# Patient Record
Sex: Female | Born: 1970 | Race: White | Hispanic: No | Smoking: Never smoker
Health system: Southern US, Community
[De-identification: ages and names within clinical notes are randomized; demographics above are authoritative.]

## PROBLEM LIST (undated history)

## (undated) DIAGNOSIS — R5382 Chronic fatigue, unspecified: Secondary | ICD-10-CM

## (undated) DIAGNOSIS — E079 Disorder of thyroid, unspecified: Secondary | ICD-10-CM

## (undated) DIAGNOSIS — G9332 Myalgic encephalomyelitis/chronic fatigue syndrome: Secondary | ICD-10-CM

## (undated) DIAGNOSIS — D649 Anemia, unspecified: Secondary | ICD-10-CM

## (undated) DIAGNOSIS — R0602 Shortness of breath: Secondary | ICD-10-CM

## (undated) DIAGNOSIS — R000 Tachycardia, unspecified: Secondary | ICD-10-CM

## (undated) DIAGNOSIS — K219 Gastro-esophageal reflux disease without esophagitis: Secondary | ICD-10-CM

## (undated) DIAGNOSIS — I959 Hypotension, unspecified: Secondary | ICD-10-CM

## (undated) DIAGNOSIS — R51 Headache: Secondary | ICD-10-CM

## (undated) DIAGNOSIS — I951 Orthostatic hypotension: Secondary | ICD-10-CM

## (undated) DIAGNOSIS — H539 Unspecified visual disturbance: Secondary | ICD-10-CM

## (undated) HISTORY — PX: TUBAL LIGATION: SHX77

## (undated) HISTORY — PX: COSMETIC SURGERY: SHX468

## (undated) HISTORY — DX: Unspecified visual disturbance: H53.9

## (undated) HISTORY — PX: UTERINE FIBROID SURGERY: SHX826

---

## 1998-07-21 ENCOUNTER — Other Ambulatory Visit: Admission: RE | Admit: 1998-07-21 | Discharge: 1998-07-21 | Payer: Self-pay | Admitting: Obstetrics and Gynecology

## 1998-09-23 ENCOUNTER — Inpatient Hospital Stay (HOSPITAL_COMMUNITY): Admission: AD | Admit: 1998-09-23 | Discharge: 1998-09-25 | Payer: Self-pay | Admitting: Obstetrics & Gynecology

## 1999-02-05 ENCOUNTER — Inpatient Hospital Stay (HOSPITAL_COMMUNITY): Admission: AD | Admit: 1999-02-05 | Discharge: 1999-02-08 | Payer: Self-pay | Admitting: Obstetrics and Gynecology

## 1999-03-22 ENCOUNTER — Other Ambulatory Visit: Admission: RE | Admit: 1999-03-22 | Discharge: 1999-03-22 | Payer: Self-pay | Admitting: Obstetrics and Gynecology

## 2000-05-02 ENCOUNTER — Other Ambulatory Visit: Admission: RE | Admit: 2000-05-02 | Discharge: 2000-05-02 | Payer: Self-pay | Admitting: Obstetrics and Gynecology

## 2001-01-10 HISTORY — PX: KIDNEY CYST REMOVAL: SHX684

## 2009-01-10 DIAGNOSIS — G90A Postural orthostatic tachycardia syndrome (POTS): Secondary | ICD-10-CM

## 2009-01-10 DIAGNOSIS — I498 Other specified cardiac arrhythmias: Secondary | ICD-10-CM

## 2009-01-10 DIAGNOSIS — I959 Hypotension, unspecified: Secondary | ICD-10-CM

## 2009-01-10 HISTORY — DX: Postural orthostatic tachycardia syndrome (POTS): G90.A

## 2009-01-10 HISTORY — DX: Other specified cardiac arrhythmias: I49.8

## 2009-01-10 HISTORY — DX: Hypotension, unspecified: I95.9

## 2011-12-16 ENCOUNTER — Encounter (HOSPITAL_COMMUNITY): Payer: Self-pay

## 2011-12-16 ENCOUNTER — Emergency Department (HOSPITAL_COMMUNITY): Payer: BC Managed Care – PPO

## 2011-12-16 ENCOUNTER — Observation Stay (HOSPITAL_COMMUNITY)
Admission: EM | Admit: 2011-12-16 | Discharge: 2011-12-18 | Disposition: A | Discharge status: Home / Self Care | Payer: BC Managed Care – PPO | Attending: Internal Medicine | Admitting: Internal Medicine

## 2011-12-16 DIAGNOSIS — G90A Postural orthostatic tachycardia syndrome (POTS): Secondary | ICD-10-CM

## 2011-12-16 DIAGNOSIS — N809 Endometriosis, unspecified: Secondary | ICD-10-CM

## 2011-12-16 DIAGNOSIS — R509 Fever, unspecified: Secondary | ICD-10-CM

## 2011-12-16 DIAGNOSIS — E876 Hypokalemia: Secondary | ICD-10-CM

## 2011-12-16 DIAGNOSIS — N760 Acute vaginitis: Secondary | ICD-10-CM

## 2011-12-16 DIAGNOSIS — B9689 Other specified bacterial agents as the cause of diseases classified elsewhere: Secondary | ICD-10-CM | POA: Insufficient documentation

## 2011-12-16 DIAGNOSIS — E063 Autoimmune thyroiditis: Secondary | ICD-10-CM

## 2011-12-16 DIAGNOSIS — R51 Headache: Secondary | ICD-10-CM | POA: Insufficient documentation

## 2011-12-16 DIAGNOSIS — I498 Other specified cardiac arrhythmias: Secondary | ICD-10-CM

## 2011-12-16 DIAGNOSIS — R55 Syncope and collapse: Principal | ICD-10-CM | POA: Diagnosis present

## 2011-12-16 DIAGNOSIS — A499 Bacterial infection, unspecified: Secondary | ICD-10-CM | POA: Insufficient documentation

## 2011-12-16 HISTORY — DX: Orthostatic hypotension: I95.1

## 2011-12-16 HISTORY — DX: Tachycardia, unspecified: R00.0

## 2011-12-16 HISTORY — DX: Hypotension, unspecified: I95.9

## 2011-12-16 HISTORY — DX: Disorder of thyroid, unspecified: E07.9

## 2011-12-16 LAB — CBC WITH DIFFERENTIAL/PLATELET
Basophils Absolute: 0 10*3/uL (ref 0.0–0.1)
Basophils Relative: 0 % (ref 0–1)
Eosinophils Relative: 0 % (ref 0–5)
HCT: 33.2 % — ABNORMAL LOW (ref 36.0–46.0)
Hemoglobin: 11.5 g/dL — ABNORMAL LOW (ref 12.0–15.0)
Lymphocytes Relative: 5 % — ABNORMAL LOW (ref 12–46)
Lymphocytes Relative: 7 % — ABNORMAL LOW (ref 12–46)
Lymphs Abs: 0.4 10*3/uL — ABNORMAL LOW (ref 0.7–4.0)
MCHC: 35.3 g/dL (ref 30.0–36.0)
MCV: 83.2 fL (ref 78.0–100.0)
Monocytes Absolute: 0.4 10*3/uL (ref 0.1–1.0)
Monocytes Relative: 8 % (ref 3–12)
Neutro Abs: 4.4 10*3/uL (ref 1.7–7.7)
Neutro Abs: 4.2 10*3/uL (ref 1.7–7.7)
Neutrophils Relative %: 91 % — ABNORMAL HIGH (ref 43–77)
Platelets: 113 10*3/uL — ABNORMAL LOW (ref 150–400)
RDW: 11.9 % (ref 11.5–15.5)
RDW: 11.7 % (ref 11.5–15.5)
WBC: 4.8 10*3/uL (ref 4.0–10.5)
WBC: 5 10*3/uL (ref 4.0–10.5)

## 2011-12-16 LAB — COMPREHENSIVE METABOLIC PANEL
ALT: 12 U/L (ref 0–35)
AST: 22 U/L (ref 0–37)
AST: 30 U/L (ref 0–37)
Albumin: 3.4 g/dL — ABNORMAL LOW (ref 3.5–5.2)
BUN: 6 mg/dL (ref 6–23)
CO2: 26 mEq/L (ref 19–32)
CO2: 27 mEq/L (ref 19–32)
Calcium: 8.9 mg/dL (ref 8.4–10.5)
Chloride: 96 mEq/L (ref 96–112)
Chloride: 96 mEq/L (ref 96–112)
Creatinine, Ser: 0.81 mg/dL (ref 0.50–1.10)
GFR calc Af Amer: >90 mL/min (ref >90)
GFR calc non Af Amer: >90 mL/min (ref >90)
GFR calc non Af Amer: >90 mL/min (ref >90)
Glucose, Bld: 100 mg/dL — ABNORMAL HIGH (ref 70–99)
Sodium: 133 mEq/L — ABNORMAL LOW (ref 135–145)
Total Bilirubin: 0.2 mg/dL — ABNORMAL LOW (ref 0.3–1.2)
Total Bilirubin: 0.3 mg/dL (ref 0.3–1.2)

## 2011-12-16 LAB — POCT I-STAT, CHEM 8
BUN: 3 mg/dL — ABNORMAL LOW (ref 6–23)
Creatinine, Ser: 1.1 mg/dL (ref 0.50–1.10)
Glucose, Bld: 140 mg/dL — ABNORMAL HIGH (ref 70–99)
Hemoglobin: 11.6 g/dL — ABNORMAL LOW (ref 12.0–15.0)
Potassium: 3 mEq/L — ABNORMAL LOW (ref 3.5–5.1)
Sodium: 134 mEq/L — ABNORMAL LOW (ref 135–145)

## 2011-12-16 LAB — WET PREP, GENITAL: Yeast Wet Prep HPF POC: NONE SEEN

## 2011-12-16 LAB — POCT I-STAT TROPONIN I

## 2011-12-16 MED ORDER — POTASSIUM CHLORIDE CRYS ER 20 MEQ PO TBCR
80.0000 meq | EXTENDED_RELEASE_TABLET | Freq: Once | ORAL | Status: AC
  Administered 2011-12-16: 80 meq via ORAL
  Filled 2011-12-16: qty 4

## 2011-12-16 MED ORDER — ONDANSETRON 4 MG PO TBDP
8.0000 mg | ORAL_TABLET | Freq: Once | ORAL | Status: AC
  Administered 2011-12-16: 8 mg via ORAL
  Filled 2011-12-16: qty 2

## 2011-12-16 MED ORDER — MAGNESIUM SULFATE 40 MG/ML IJ SOLN
2.0000 g | Freq: Once | INTRAMUSCULAR | Status: AC
  Administered 2011-12-16: 2 g via INTRAVENOUS
  Filled 2011-12-16: qty 50

## 2011-12-16 NOTE — ED Notes (Signed)
Pt metating appropriately. Pt denies dizziness currently while laying in bed. Pts boyfriend states pt was "catatonic like" in pain for nearly 2 days. Pt denies chest pain. Pt denies shortness of breath currently but states increases with walking. Pt states nausea.

## 2011-12-16 NOTE — ED Notes (Signed)
Pt updated on wait time and care 

## 2011-12-16 NOTE — ED Notes (Addendum)
Arthor Captain, PA at bedside. States pt has POTS and is unable to regulate autonomic response system. Pt has endometriosis. Autoimmune disorder of thyroid. Pt has c/o severe abdominal pain for past 2 days. Pt had firstsyncopal episode Tuesday.

## 2011-12-16 NOTE — ED Provider Notes (Signed)
History     CSN: [id number]  Arrival date & time 12/16/11  1519   First MD Initiated Contact with Patient 12/16/11 1948      Chief Complaint  Patient presents with  . Headache  . Nausea  . Abdominal Pain    (Consider location/radiation/quality/duration/timing/severity/associated sxs/prior treatment) HPI Comments: 41 year old female with a past medical history of postural orthostatic tachycardia syndrome, endometriosis and Hashimoto's thyroiditis presents today with chief complaint of abdominal pain and hypotension.  Patient was seen today by her physician and given 2 bags of IV fluid for chronic hypertension. She has had increasing pelvic pain which she attributes to her endometriosis.  Patient had a fever of 103 this week and one episode of syncope which she also attributes to her POTS. She has never fully syncopized before. Patient's lab's show hypokalemia. Patient denies urinary symptoms or vaginal symptoms.    Denies DOE, SOB, chest tightness or pressure, radiation to left arm, jaw or back, or diaphoresis. Denies dysuria, flank pain, frequency, urgency, or hematuria. Denies headaches, visual disturbances. Denies vomiting or diarrhea.   The history is provided by the patient. No language interpreter was used.    Past Medical History  Diagnosis Date  . Thyroid disease   . Hypotension 2011  . POTS (postural orthostatic tachycardia syndrome) 2011    Past Surgical History  Procedure Date  . Cesarean section   . Cosmetic surgery   . Uterine fibroid surgery   . Kidney cyst removal 2003    No family history on file.  History  Substance Use Topics  . Smoking status: Never Smoker   . Smokeless tobacco: Not on file  . Alcohol Use: Yes    OB History    Grav Para Term Preterm Abortions TAB SAB Ect Mult Living                  Review of Systems Ten systems are reviewed and are negative for acute change except as noted in the HPI  Allergies  Sulfa antibiotics  Home  Medications   Current Outpatient Rx  Name  Route  Sig  Dispense  Refill  . AMPHETAMINE-DEXTROAMPHET ER 15 MG PO CP24   Oral   Take 15 mg by mouth daily as needed. For energy         . FLUDROCORTISONE ACETATE 0.1 MG PO TABS   Oral   Take 0.05 mg by mouth 2 (two) times daily.         . IBUPROFEN 200 MG PO TABS   Oral   Take 200 mg by mouth every 6 (six) hours as needed. For pain         . THYROID 60 MG PO TABS   Oral   Take 60 mg by mouth daily.           BP 99/62  Pulse 70  Temp 101.7 F (38.7 C) (Oral)  Resp 21  SpO2 95%  LMP 12/11/2011  Physical Exam  Nursing note and vitals reviewed. Constitutional: She is oriented to person, place, and time. She appears well-developed. No distress.       Thin   HENT:  Head: Normocephalic and atraumatic.  Eyes: Conjunctivae normal and EOM are normal. Pupils are equal, round, and reactive to light.  Cardiovascular: Normal rate, regular rhythm and normal heart sounds.   Pulmonary/Chest: Effort normal and breath sounds normal.  Abdominal: Soft. Bowel sounds are normal. She exhibits no distension. There is tenderness (suprapubic).  Genitourinary:  Pelvic exam: normal external genitalia, vulva, vagina, cervix, uterus and adnexa.   Musculoskeletal: Normal range of motion. She exhibits no edema and no tenderness.  Neurological: She is alert and oriented to person, place, and time.  Skin: Skin is warm and dry. She is not diaphoretic.    ED Course  Procedures (including critical care time)  Results for orders placed during the hospital encounter of 12/16/11  CBC WITH DIFFERENTIAL      Component Value Range   WBC 4.8  4.0 - 10.5 K/uL   RBC 4.02  3.87 - 5.11 MIL/uL   Hemoglobin 11.9 (*) 12.0 - 15.0 g/dL   HCT 40.9 (*) 81.1 - 91.4 %   MCV 83.8  78.0 - 100.0 fL   MCH 29.6  26.0 - 34.0 pg   MCHC 35.3  30.0 - 36.0 g/dL   RDW 78.2  95.6 - 21.3 %   Platelets 113 (*) 150 - 400 K/uL   Neutrophils Relative 91 (*) 43 - 77 %    Neutro Abs 4.4  1.7 - 7.7 K/uL   Lymphocytes Relative 5 (*) 12 - 46 %   Lymphs Abs 0.3 (*) 0.7 - 4.0 K/uL   Monocytes Relative 4  3 - 12 %   Monocytes Absolute 0.2  0.1 - 1.0 K/uL   Eosinophils Relative 0  0 - 5 %   Eosinophils Absolute 0.0  0.0 - 0.7 K/uL   Basophils Relative 0  0 - 1 %   Basophils Absolute 0.0  0.0 - 0.1 K/uL  COMPREHENSIVE METABOLIC PANEL      Component Value Range   Sodium 133 (*) 135 - 145 mEq/L   Potassium 2.8 (*) 3.5 - 5.1 mEq/L   Chloride 96  96 - 112 mEq/L   CO2 26  19 - 32 mEq/L   Glucose, Bld 143 (*) 70 - 99 mg/dL   BUN 5 (*) 6 - 23 mg/dL   Creatinine, Ser 0.86  0.50 - 1.10 mg/dL   Calcium 8.9  8.4 - 57.8 mg/dL   Total Protein 6.8  6.0 - 8.3 g/dL   Albumin 3.4 (*) 3.5 - 5.2 g/dL   AST 22  0 - 37 U/L   ALT 12  0 - 35 U/L   Alkaline Phosphatase 42  39 - 117 U/L   Total Bilirubin 0.2 (*) 0.3 - 1.2 mg/dL   GFR calc non Af Amer >90  >90 mL/min   GFR calc Af Amer >90  >90 mL/min  POCT PREGNANCY, URINE      Component Value Range   Preg Test, Ur NEGATIVE  NEGATIVE  POCT I-STAT, CHEM 8      Component Value Range   Sodium 134 (*) 135 - 145 mEq/L   Potassium 3.0 (*) 3.5 - 5.1 mEq/L   Chloride 98  96 - 112 mEq/L   BUN 3 (*) 6 - 23 mg/dL   Creatinine, Ser 4.69  0.50 - 1.10 mg/dL   Glucose, Bld 629 (*) 70 - 99 mg/dL   Calcium, Ion 5.28 (*) 1.12 - 1.23 mmol/L   TCO2 26  0 - 100 mmol/L   Hemoglobin 11.6 (*) 12.0 - 15.0 g/dL   HCT 41.3 (*) 24.4 - 01.0 %  POCT I-STAT TROPONIN I      Component Value Range   Troponin i, poc 0.00  0.00 - 0.08 ng/mL   Comment 3           CBC WITH DIFFERENTIAL  Component Value Range   WBC 5.0  4.0 - 10.5 K/uL   RBC 3.99  3.87 - 5.11 MIL/uL   Hemoglobin 11.5 (*) 12.0 - 15.0 g/dL   HCT 40.9 (*) 81.1 - 91.4 %   MCV 83.2  78.0 - 100.0 fL   MCH 28.8  26.0 - 34.0 pg   MCHC 34.6  30.0 - 36.0 g/dL   RDW 78.2  95.6 - 21.3 %   Platelets 94 (*) 150 - 400 K/uL   Neutrophils Relative 85 (*) 43 - 77 %   Neutro Abs 4.2  1.7 - 7.7  K/uL   Lymphocytes Relative 7 (*) 12 - 46 %   Lymphs Abs 0.4 (*) 0.7 - 4.0 K/uL   Monocytes Relative 8  3 - 12 %   Monocytes Absolute 0.4  0.1 - 1.0 K/uL   Eosinophils Relative 0  0 - 5 %   Eosinophils Absolute 0.0  0.0 - 0.7 K/uL   Basophils Relative 1  0 - 1 %   Basophils Absolute 0.0  0.0 - 0.1 K/uL  COMPREHENSIVE METABOLIC PANEL      Component Value Range   Sodium 133 (*) 135 - 145 mEq/L   Potassium 3.3 (*) 3.5 - 5.1 mEq/L   Chloride 96  96 - 112 mEq/L   CO2 27  19 - 32 mEq/L   Glucose, Bld 100 (*) 70 - 99 mg/dL   BUN 6  6 - 23 mg/dL   Creatinine, Ser 0.86  0.50 - 1.10 mg/dL   Calcium 8.9  8.4 - 57.8 mg/dL   Total Protein 6.3  6.0 - 8.3 g/dL   Albumin 3.2 (*) 3.5 - 5.2 g/dL   AST 30  0 - 37 U/L   ALT 17  0 - 35 U/L   Alkaline Phosphatase 39  39 - 117 U/L   Total Bilirubin 0.3  0.3 - 1.2 mg/dL   GFR calc non Af Amer >90  >90 mL/min   GFR calc Af Amer >90  >90 mL/min    Date: 12/16/2011  Rate: 83  Rhythm: normal sinus rhythm  QRS Axis: normal  Intervals: normal  ST/T Wave abnormalities: normal  Conduction Disutrbances: none  Narrative Interpretation:   Old EKG Reviewed: No significant changes noted       1. Hypokalemia   2. Syncope       MDM  11:37 PM BP 106/64  Pulse 79  Temp 99.4 F (37.4 C) (Oral)  Resp 18  SpO2 100%  LMP 12/11/2011  Patient hypokalemic  To 2.8 on presentation. With repletion, now 3.3. Pelvic exam performed without visible abnormality.   Awainting wet prep results.   Dr. Joneen Roach will consult on the patient for admission for new onset syncope.      Arthor Captain, PA-C 12/17/11 1134  Arthor Captain, PA-C 12/17/11 [address] PA-C 12/17/11 1138

## 2011-12-16 NOTE — ED Notes (Signed)
Pt denies nausea; pt states feeling chills. Pt denies shortness of breath and chest pain currently.

## 2011-12-16 NOTE — ED Notes (Signed)
Pt states has had fever since early Wednesday. Pt took ibuprofen which helped some.

## 2011-12-16 NOTE — ED Notes (Signed)
Pt sts she has pots syndrome.  Pt sts she was at the clinic earlier and received 2 bags of fluids, to get her bp up, which she sts was 86/42.  Pt sts the fluids did not help her bp.  Pt sts she has endometriosis and is experiencing a lot of pain

## 2011-12-17 ENCOUNTER — Other Ambulatory Visit: Payer: Self-pay

## 2011-12-17 ENCOUNTER — Encounter (HOSPITAL_COMMUNITY): Payer: Self-pay | Admitting: Family Medicine

## 2011-12-17 DIAGNOSIS — A499 Bacterial infection, unspecified: Secondary | ICD-10-CM

## 2011-12-17 DIAGNOSIS — B9689 Other specified bacterial agents as the cause of diseases classified elsewhere: Secondary | ICD-10-CM

## 2011-12-17 DIAGNOSIS — E876 Hypokalemia: Secondary | ICD-10-CM

## 2011-12-17 DIAGNOSIS — N760 Acute vaginitis: Secondary | ICD-10-CM

## 2011-12-17 DIAGNOSIS — R55 Syncope and collapse: Secondary | ICD-10-CM | POA: Diagnosis present

## 2011-12-17 DIAGNOSIS — R000 Tachycardia, unspecified: Secondary | ICD-10-CM

## 2011-12-17 DIAGNOSIS — R509 Fever, unspecified: Secondary | ICD-10-CM

## 2011-12-17 DIAGNOSIS — N809 Endometriosis, unspecified: Secondary | ICD-10-CM

## 2011-12-17 DIAGNOSIS — E063 Autoimmune thyroiditis: Secondary | ICD-10-CM

## 2011-12-17 DIAGNOSIS — I951 Orthostatic hypotension: Secondary | ICD-10-CM

## 2011-12-17 DIAGNOSIS — I498 Other specified cardiac arrhythmias: Secondary | ICD-10-CM

## 2011-12-17 LAB — CBC
Hemoglobin: 10.7 g/dL — ABNORMAL LOW (ref 12.0–15.0)
MCH: 29.2 pg (ref 26.0–34.0)
MCHC: 35 g/dL (ref 30.0–36.0)
Platelets: 93 10*3/uL — ABNORMAL LOW (ref 150–400)
RBC: 3.67 MIL/uL — ABNORMAL LOW (ref 3.87–5.11)

## 2011-12-17 LAB — URINALYSIS, ROUTINE W REFLEX MICROSCOPIC
Hgb urine dipstick: NEGATIVE
Leukocytes, UA: NEGATIVE
Specific Gravity, Urine: 1.008 (ref 1.005–1.030)

## 2011-12-17 LAB — CREATININE, SERUM
Creatinine, Ser: 0.79 mg/dL (ref 0.50–1.10)
GFR calc non Af Amer: >90 mL/min (ref >90)

## 2011-12-17 LAB — LACTIC ACID, PLASMA: Lactic Acid, Venous: 1.5 mmol/L (ref 0.5–2.2)

## 2011-12-17 MED ORDER — ONDANSETRON HCL 4 MG PO TABS
4.0000 mg | ORAL_TABLET | Freq: Four times a day (QID) | ORAL | Status: DC | PRN
  Administered 2011-12-17: 4 mg via ORAL

## 2011-12-17 MED ORDER — ALUM & MAG HYDROXIDE-SIMETH 200-200-20 MG/5ML PO SUSP: 30.0000 mL | Freq: Four times a day (QID) | ORAL | Status: DC | PRN

## 2011-12-17 MED ORDER — ONDANSETRON HCL 4 MG/2ML IJ SOLN
4.0000 mg | Freq: Four times a day (QID) | INTRAMUSCULAR | Status: DC | PRN
  Filled 2011-12-17: qty 2

## 2011-12-17 MED ORDER — MORPHINE SULFATE 2 MG/ML IJ SOLN: 2.0000 mg | INTRAMUSCULAR | Status: DC | PRN

## 2011-12-17 MED ORDER — FLUDROCORTISONE ACETATE 0.1 MG PO TABS
0.0500 mg | ORAL_TABLET | Freq: Two times a day (BID) | ORAL | Status: DC
  Administered 2011-12-17: 0.05 mg via ORAL
  Administered 2011-12-17: 11:00:00 via ORAL
  Administered 2011-12-18: 0.05 mg via ORAL
  Filled 2011-12-17 (×5): qty 0.5

## 2011-12-17 MED ORDER — ACETAMINOPHEN 650 MG RE SUPP: 650.0000 mg | Freq: Four times a day (QID) | RECTAL | Status: DC | PRN

## 2011-12-17 MED ORDER — ENOXAPARIN SODIUM 40 MG/0.4ML ~~LOC~~ SOLN
40.0000 mg | SUBCUTANEOUS | Status: DC
  Administered 2011-12-17 – 2011-12-18 (×2): 40 mg via SUBCUTANEOUS
  Filled 2011-12-17 (×2): qty 0.4

## 2011-12-17 MED ORDER — ACETAMINOPHEN 325 MG PO TABS
650.0000 mg | ORAL_TABLET | Freq: Four times a day (QID) | ORAL | Status: DC | PRN
  Administered 2011-12-17 (×3): 650 mg via ORAL
  Filled 2011-12-17 (×3): qty 2

## 2011-12-17 MED ORDER — SODIUM CHLORIDE 0.9 % IJ SOLN: 3.0000 mL | Freq: Two times a day (BID) | INTRAMUSCULAR | Status: DC

## 2011-12-17 MED ORDER — POTASSIUM CHLORIDE CRYS ER 20 MEQ PO TBCR
40.0000 meq | EXTENDED_RELEASE_TABLET | Freq: Once | ORAL | Status: AC
  Administered 2011-12-17: 40 meq via ORAL
  Filled 2011-12-17: qty 2

## 2011-12-17 MED ORDER — HYDROCODONE-ACETAMINOPHEN 5-325 MG PO TABS
1.0000 | ORAL_TABLET | ORAL | Status: DC | PRN
  Administered 2011-12-18: 1 via ORAL
  Filled 2011-12-17: qty 1

## 2011-12-17 MED ORDER — POTASSIUM CHLORIDE IN NACL 20-0.9 MEQ/L-% IV SOLN
INTRAVENOUS | Status: DC
  Administered 2011-12-17 – 2011-12-18 (×4): via INTRAVENOUS
  Filled 2011-12-17 (×8): qty 1000

## 2011-12-17 MED ORDER — SENNOSIDES-DOCUSATE SODIUM 8.6-50 MG PO TABS
1.0000 | ORAL_TABLET | Freq: Every evening | ORAL | Status: DC | PRN
  Filled 2011-12-17: qty 1

## 2011-12-17 MED ORDER — METRONIDAZOLE 500 MG PO TABS
2000.0000 mg | ORAL_TABLET | Freq: Once | ORAL | Status: AC
  Administered 2011-12-17: 2000 mg via ORAL
  Filled 2011-12-17: qty 4

## 2011-12-17 MED ORDER — THYROID 60 MG PO TABS
60.0000 mg | ORAL_TABLET | Freq: Every day | ORAL | Status: DC
  Administered 2011-12-17 – 2011-12-18 (×2): 60 mg via ORAL
  Filled 2011-12-17 (×2): qty 1

## 2011-12-17 NOTE — H&P (Addendum)
PCP:   Dr. Danae Chen Endocrinologist: Dr. Juliet Rude Neurologist: dr Charmayne Sheer   Chief Complaint:  Dizziness/syncope  HPI: This is an unfortunate 41 year old female with posterior orthostatic tachycardic syndrome (POTS) diagnosed approximately 2 years ago. She's never been on medication for this. She states her symptoms are often worse with her menstrual period. She also has endometriosis diagnosed by exploratory laparoscopy. With this menstrual cycle her symptoms again flared, however, they were different and worse than normal. Initially, she she was inr lightheadedness, dizziness and tachycardic on standing. She was also hypotensive with systolic blood pressures in the mid 80s. On Wednesday she got out of bed and syncopized. This is the first time she syncopized with her POTS disease. She's has severe headache worse on standing, she is nauseous and not eating. Her menses lasted Monday through Wednesday. On Wednesday she saw her Neurologist Dr Charmayne Sheer, this is her first visit. After hearing her symptoms he prescribed her Florinef which she's been taking 0.5 mg twice daily. She has continued to feel badly and has remained hypotensive and so she's been in bed since Wednesday. She's continued to have low-grade fevers. Today she went to an urgent care clinic where she was given 2 L of normal saline, this usually makes her feel better. She went home she continued to feel ill, she continued to have low-grade and fevers and so she came to the ER. In the ER the patient's systolic blood pressure is now in the low 100s. She's also continued to have abdominal discomfort despite the fact that her menses has stopped. History provided by the patient. She denies any burning urination, diarrhea, cough, shortness of breath, myalgia, viral symptoms.  Review of Systems:  The patient denies anorexia, weight loss,, vision loss, decreased hearing, hoarseness, chest pain, dyspnea on exertion, peripheral edema, balance deficits,  hemoptysis, melena, hematochezia, severe indigestion/heartburn, hematuria, incontinence, genital sores, muscle weakness, suspicious skin lesions, transient blindness, difficulty walking, depression, unusual weight change, abnormal bleeding, enlarged lymph nodes, angioedema, and breast masses.  Past Medical History: Past Medical History  Diagnosis Date  . Thyroid disease   . Hypotension 2011  . POTS (postural orthostatic tachycardia syndrome) 2011   Past Surgical History  Procedure Date  . Cesarean section   . Cosmetic surgery   . Uterine fibroid surgery   . Kidney cyst removal 2003    Medications: Prior to Admission medications   Medication Sig Start Date End Date Taking? Authorizing Provider  amphetamine-dextroamphetamine (ADDERALL XR) 15 MG 24 hr capsule Take 15 mg by mouth daily as needed. For energy   Yes Historical Provider, MD  fludrocortisone (FLORINEF) 0.1 MG tablet Take 0.05 mg by mouth 2 (two) times daily.   Yes Historical Provider, MD  ibuprofen (ADVIL,MOTRIN) 200 MG tablet Take 200 mg by mouth every 6 (six) hours as needed. For pain   Yes Historical Provider, MD  thyroid (ARMOUR) 60 MG tablet Take 60 mg by mouth daily.   Yes Historical Provider, MD    Allergies:   Allergies  Allergen Reactions  . Sulfa Antibiotics Nausea And Vomiting    Social History:  reports that she has never smoked. She does not have any smokeless tobacco history on file. She reports that she drinks alcohol. Her drug history not on file.  Family History: Lung cancer  Physical Exam: Filed Vitals:   12/17/11 0030 12/17/11 0100 12/17/11 0130 12/17/11 0140  BP: 99/72 93/72 105/63   Pulse: 90 87 92   Temp:    103 F (39.4 C)  TempSrc:    Oral  Resp: 17 21 19    SpO2: 96% 98% 99%     General:  Alert and oriented times three, well developed and nourished, weak-appearing female Eyes: PERRLA, pink conjunctiva, no scleral icterus ENT: dry oral mucosa, neck supple, no thyromegaly Lungs:  clear to ascultation, no wheeze, no crackles, no use of accessory muscles Cardiovascular: regular rate and rhythm, no regurgitation, no gallops, no murmurs. No carotid bruits, no JVD Abdomen: soft, positive BS, nonspecific generalized tenderness to palpation, non-distended, no organomegaly, not an acute abdomen GU: not examined Neuro: CN II - XII grossly intact, sensation intact Musculoskeletal: strength 5/5 all extremities, no clubbing, cyanosis or edema Skin: no rash, no subcutaneous crepitation, no decubitus Psych: appropriate patient   Labs on Admission:   Basename 12/16/11 2050 12/16/11 1603 12/16/11 1540  NA 133* 134* --  K 3.3* 3.0* --  CL 96 98 --  CO2 27 -- 26  GLUCOSE 100* 140* --  BUN 6 3* --  CREATININE 0.81 1.10 --  CALCIUM 8.9 -- 8.9  MG -- -- --  PHOS -- -- --    Basename 12/16/11 2050 12/16/11 1540  AST 30 22  ALT 17 12  ALKPHOS 39 42  BILITOT 0.3 0.2*  PROT 6.3 6.8  ALBUMIN 3.2* 3.4*   No results found for this basename: LIPASE:2,AMYLASE:2 in the last 72 hours  Basename 12/16/11 2050 12/16/11 1603 12/16/11 1540  WBC 5.0 -- 4.8  NEUTROABS 4.2 -- 4.4  HGB 11.5* 11.6* --  HCT 33.2* 34.0* --  MCV 83.2 -- 83.8  PLT 94* -- 113*    Radiological Exams on Admission: Dg Chest Port 1 View  12/16/2011  *RADIOLOGY REPORT*  Clinical Data: Fever, syncope.  PORTABLE CHEST - 1 VIEW  Comparison: None.  Findings: Heart and mediastinal contours are within normal limits. No focal opacities or effusions.  No acute bony abnormality.  IMPRESSION: No active cardiopulmonary disease.   Original Report Authenticated By: Charlett Nose, M.D.     Assessment/Plan Present on Admission:  . Syncope and collapse Posterior orthostatic tachycardia syndrome Bring in for 23 hour for observation Continue aggressive IV fluid hydration , orthostatic vitals in a.m. Blood pressure already improved If blood pressure remains low consider adding Midodrine Continue Florinef Syncope likely due  to hypotension Fever  Doubt infection. Will order urinalysis and cultures. Fever is not a usual presentation with POTS syndrome. No leukocytosis. Will monitor, no antibiotics started. Hypokalemia Likely from Florinef, replacing and IV fluids Abdominal pain/endometriosis Monitor. Pain control  Full code DVT prophylaxis   Shep Porter 12/17/2011, 1:53 AM

## 2011-12-17 NOTE — Progress Notes (Addendum)
[facility]             Progress Note   Subjective: Still febrile and hypotensive. No true complaints. Has noticed increased vaginal secretions.  Objective: Vital signs in last 24 hours: Temp:  [98.7 F (37.1 C)-103 F (39.4 C)] 100.7 F (38.2 C) (12/07 1015) Pulse Rate:  [61-97] 75  (12/07 0652) Resp:  [14-21] 18  (12/07 0652) BP: (83-108)/(51-72) 83/51 mmHg (12/07 0652) SpO2:  [95 %-100 %] 100 % (12/07 0652) Weight:  [55.384 kg (122 lb 1.6 oz)] 55.384 kg (122 lb 1.6 oz) (12/07 0203) Weight change:  Last BM Date: 12/17/11  Intake/Output from previous day:   Total I/O In: 240 [P.O.:240] Out: -    Physical Exam: General: Alert, awake, oriented x3, in no acute distress. HEENT: No bruits, no goiter. Heart: Regular rate and rhythm, without murmurs, rubs, gallops. Lungs: Clear to auscultation bilaterally. Abdomen: Soft, nontender, nondistended, positive bowel sounds. Extremities: No clubbing cyanosis or edema with positive pedal pulses. Neuro: Grossly intact, nonfocal. I have not ambulated her.    Lab Results: Basic Metabolic Panel:  Basename 12/17/11 0300 12/16/11 2050 12/16/11 1603 12/16/11 1540  NA -- 133* 134* --  K -- 3.3* 3.0* --  CL -- 96 98 --  CO2 -- 27 -- 26  GLUCOSE -- 100* 140* --  BUN -- 6 3* --  CREATININE 0.79 0.81 -- --  CALCIUM -- 8.9 -- 8.9  MG -- -- -- --  PHOS -- -- -- --   Liver Function Tests:  Basename 12/16/11 2050 12/16/11 1540  AST 30 22  ALT 17 12  ALKPHOS 39 42  BILITOT 0.3 0.2*  PROT 6.3 6.8  ALBUMIN 3.2* 3.4*   CBC:  Basename 12/17/11 0300 12/16/11 2050 12/16/11 1540  WBC 3.5* 5.0 --  NEUTROABS -- 4.2 4.4  HGB 10.7* 11.5* --  HCT 30.6* 33.2* --  MCV 83.4 83.2 --  PLT 93* 94* --   Urinalysis:  Basename 12/17/11 0651  COLORURINE YELLOW  LABSPEC 1.008  PHURINE 7.0  GLUCOSEU NEGATIVE  HGBUR NEGATIVE  BILIRUBINUR NEGATIVE  KETONESUR 15*  PROTEINUR NEGATIVE  UROBILINOGEN 1.0  NITRITE NEGATIVE   LEUKOCYTESUR NEGATIVE    Recent Results (from the past 240 hour(s))  WET PREP, GENITAL     Status: Abnormal   Collection Time   12/16/11 11:38 PM      Component Value Range Status Comment   Yeast Wet Prep HPF POC NONE SEEN  NONE SEEN Final    Trich, Wet Prep NONE SEEN  NONE SEEN Final    Clue Cells Wet Prep HPF POC MANY (*) NONE SEEN Final    WBC, Wet Prep HPF POC MODERATE (*) NONE SEEN Final     Studies/Results: Dg Chest Port 1 View  12/16/2011  *RADIOLOGY REPORT*  Clinical Data: Fever, syncope.  PORTABLE CHEST - 1 VIEW  Comparison: None.  Findings: Heart and mediastinal contours are within normal limits. No focal opacities or effusions.  No acute bony abnormality.  IMPRESSION: No active cardiopulmonary disease.   Original Report Authenticated By: Charlett Nose, M.D.     Medications: Scheduled Meds:    . enoxaparin (LOVENOX) injection  40 mg Subcutaneous Q24H  . fludrocortisone  0.05 mg Oral BID  . [COMPLETED] magnesium sulfate 1 - 4 g bolus IVPB  2 g Intravenous Once  . metroNIDAZOLE  2,000 mg Oral Once  . [COMPLETED] ondansetron  8 mg Oral Once  . potassium chloride  40 mEq Oral Once  . [  COMPLETED] potassium chloride  80 mEq Oral Once  . sodium chloride  3 mL Intravenous Q12H  . thyroid  60 mg Oral Daily   Continuous Infusions:    . 0.9 % NaCl with KCl 20 mEq / L 125 mL/hr at 12/17/11 1117   PRN Meds:.acetaminophen, acetaminophen, alum & mag hydroxide-simeth, HYDROcodone-acetaminophen, morphine injection, ondansetron (ZOFRAN) IV, ondansetron, senna-docusate  Assessment/Plan:  Active Problems:  Syncope and collapse  POTS (postural orthostatic tachycardia syndrome)  Endometriosis  Hashimoto's thyroiditis  Hypokalemia  BV (bacterial vaginosis)  Fever   Syncope -Likely 2/2 POTS. -No further work up.  POTS -Has been started on florinef. -Continue IVFs.  BV -Give flagyl 2000 mg x 1.  Fever -A little concerning especially given her hypotension. -CXR and U/A  are negative. -Will check blood cultures, influenza panel and lactic acid.  Hypokalemia -Give KDur 40 meq today and recheck in am.  Disposition -Consider DC home once fever resolved, hopefully in am.     Time spent coordinating care: 35 minutes.   LOS: 1 day   [facility] Pager: [phone] 12/17/2011, 12:52 PM

## 2011-12-17 NOTE — ED Notes (Signed)
Informed pt of unit assignment. Waiting for bed assigment

## 2011-12-17 NOTE — ED Notes (Signed)
Tylenol given for temp 103 orally. Pt has 5-6 blanket on , encourage pt to reduced the numbers of blanket she using covering herself. Pt taking PO well.

## 2011-12-17 NOTE — ED Notes (Signed)
Informed RN Imma of patient's temperature at this time.

## 2011-12-18 LAB — GC/CHLAMYDIA PROBE AMP
CT Probe RNA: NEGATIVE
GC Probe RNA: NEGATIVE

## 2011-12-18 LAB — BASIC METABOLIC PANEL
CO2: 24 mEq/L (ref 19–32)
GFR calc non Af Amer: >90 mL/min (ref >90)
Glucose, Bld: 97 mg/dL (ref 70–99)
Potassium: 4.1 mEq/L (ref 3.5–5.1)
Sodium: 137 mEq/L (ref 135–145)

## 2011-12-18 LAB — CBC
Hemoglobin: 9.7 g/dL — ABNORMAL LOW (ref 12.0–15.0)
RBC: 3.35 MIL/uL — ABNORMAL LOW (ref 3.87–5.11)

## 2011-12-18 NOTE — Discharge Summary (Signed)
Physician Discharge Summary  Patient ID: Lisa Rios MRN: [id number] DOB/AGE: August 09, 1970 41 y.o.  Admit date: 12/16/2011 Discharge date: 12/18/2011  Primary Care Physician:  No primary provider on file.   Discharge Diagnoses:    Active Problems:  Syncope and collapse  POTS (postural orthostatic tachycardia syndrome)  Endometriosis  Hashimoto's thyroiditis  Hypokalemia  BV (bacterial vaginosis)  Fever      Medication List     As of 12/18/2011 11:25 AM    TAKE these medications         amphetamine-dextroamphetamine 15 MG 24 hr capsule   Commonly known as: ADDERALL XR   Take 15 mg by mouth daily as needed. For energy      fludrocortisone 0.1 MG tablet   Commonly known as: FLORINEF   Take 0.05 mg by mouth 2 (two) times daily.      ibuprofen 200 MG tablet   Commonly known as: ADVIL,MOTRIN   Take 200 mg by mouth every 6 (six) hours as needed. For pain      thyroid 60 MG tablet   Commonly known as: ARMOUR   Take 60 mg by mouth daily.         Disposition and Follow-up:  Will be discharged home today in stable and improved condition. Has been instructed to follow up with her neurologist as scheduled.  Consults:  None.   Significant Diagnostic Studies:  Dg Chest Port 1 View  12/16/2011  *RADIOLOGY REPORT*  Clinical Data: Fever, syncope.  PORTABLE CHEST - 1 VIEW  Comparison: None.  Findings: Heart and mediastinal contours are within normal limits. No focal opacities or effusions.  No acute bony abnormality.  IMPRESSION: No active cardiopulmonary disease.   Original Report Authenticated By: Charlett Nose, M.D.     Brief H and P For complete details please refer to admission H and P, but in brief patient is a 41 year old female with posterior orthostatic tachycardic syndrome (POTS) diagnosed approximately 2 years ago. She's never been on medication for this. She states her symptoms are often worse with her menstrual period. She also has endometriosis  diagnosed by exploratory laparoscopy. With this menstrual cycle her symptoms again flared, however, they were different and worse than normal. Initially, she she was inr lightheadedness, dizziness and tachycardic on standing. She was also hypotensive with systolic blood pressures in the mid 80s. On Wednesday she got out of bed and syncopized. This is the first time she syncopized with her POTS disease.Today she went to an urgent care clinic where she was given 2 L of normal saline, this usually makes her feel better. She went home she continued to feel ill, she continued to have low-grade and fevers and so she came to the ER. We were asked to admit her for further evaluation and management.      Hospital Course:  Active Problems:  Syncope and collapse  POTS (postural orthostatic tachycardia syndrome)  Endometriosis  Hashimoto's thyroiditis  Hypokalemia  BV (bacterial vaginosis)  Fever   Syncope -2/2 POTS disease. -Continue florinef. -Have also recommended TED stockings.  Bacterial Vaginosis -Has been given 2 gf of Flagyl.  Fever -Last temp was 100.7 24 hours ago. -Source remains unclear. -All cx data is negative. -Flu screen is negative. -Wonder if related to her autonomic dysfunction? -Doubt BV could have caused her low-grade temps.  Hypokalemia -Repleted.   Time spent on Discharge: Greater than 30 minutes.  SignedChaya Jan [facility] Pager: [phone] 12/18/2011, 11:25 AM

## 2011-12-18 NOTE — Progress Notes (Signed)
Utilization review completed.  

## 2011-12-18 NOTE — ED Provider Notes (Signed)
Medical screening examination/treatment/procedure(s) were performed by non-physician practitioner and as supervising physician I was immediately available for consultation/collaboration  Derwood Kaplan, MD 12/18/11 1610

## 2011-12-23 LAB — CULTURE, BLOOD (ROUTINE X 2): Culture: NO GROWTH

## 2012-01-02 ENCOUNTER — Other Ambulatory Visit: Payer: Self-pay | Admitting: Internal Medicine

## 2012-01-02 ENCOUNTER — Ambulatory Visit
Admission: RE | Admit: 2012-01-02 | Discharge: 2012-01-02 | Disposition: A | Discharge status: Home / Self Care | Payer: BC Managed Care – PPO | Source: Ambulatory Visit | Attending: Internal Medicine | Admitting: Internal Medicine

## 2012-01-02 DIAGNOSIS — R053 Chronic cough: Secondary | ICD-10-CM

## 2012-01-02 DIAGNOSIS — R509 Fever, unspecified: Secondary | ICD-10-CM

## 2012-01-02 DIAGNOSIS — R109 Unspecified abdominal pain: Secondary | ICD-10-CM

## 2012-01-02 DIAGNOSIS — R05 Cough: Secondary | ICD-10-CM

## 2012-01-02 MED ORDER — IOHEXOL 300 MG/ML  SOLN
100.0000 mL | Freq: Once | INTRAMUSCULAR | Status: AC | PRN
  Administered 2012-01-02: 100 mL via INTRAVENOUS

## 2012-06-27 ENCOUNTER — Encounter (HOSPITAL_COMMUNITY): Payer: Self-pay | Admitting: Pharmacist

## 2012-06-29 ENCOUNTER — Encounter (HOSPITAL_COMMUNITY)
Admission: RE | Admit: 2012-06-29 | Discharge: 2012-06-29 | Disposition: A | Discharge status: Home / Self Care | Payer: BC Managed Care – PPO | Source: Ambulatory Visit | Attending: Obstetrics and Gynecology | Admitting: Obstetrics and Gynecology

## 2012-06-29 ENCOUNTER — Encounter (HOSPITAL_COMMUNITY): Payer: Self-pay

## 2012-06-29 HISTORY — DX: Anemia, unspecified: D64.9

## 2012-06-29 HISTORY — DX: Chronic fatigue, unspecified: R53.82

## 2012-06-29 HISTORY — DX: Shortness of breath: R06.02

## 2012-06-29 HISTORY — DX: Headache: R51

## 2012-06-29 HISTORY — DX: Gastro-esophageal reflux disease without esophagitis: K21.9

## 2012-06-29 HISTORY — DX: Myalgic encephalomyelitis/chronic fatigue syndrome: G93.32

## 2012-06-29 LAB — CBC
HCT: 35.3 % — ABNORMAL LOW (ref 36.0–46.0)
MCV: 86.1 fL (ref 78.0–100.0)
Platelets: 224 10*3/uL (ref 150–400)
RBC: 4.1 MIL/uL (ref 3.87–5.11)
RDW: 12.6 % (ref 11.5–15.5)
WBC: 7.4 10*3/uL (ref 4.0–10.5)

## 2012-06-29 LAB — BASIC METABOLIC PANEL
Calcium: 9.6 mg/dL (ref 8.4–10.5)
GFR calc non Af Amer: 78 mL/min — ABNORMAL LOW (ref >90)
Glucose, Bld: 90 mg/dL (ref 70–99)
Potassium: 4.2 mEq/L (ref 3.5–5.1)
Sodium: 136 mEq/L (ref 135–145)

## 2012-06-29 LAB — SURGICAL PCR SCREEN: Staphylococcus aureus: NEGATIVE

## 2012-06-29 NOTE — Patient Instructions (Signed)
Your procedure is scheduled on:07/02/12  Enter through the Main Entrance at :6am Pick up desk phone and dial [phone] and inform us of your arrival.  Please call [phone] if you have any problems the morning of surgery.  Remember: Do not eat or drink after midnight:Sunday- clear liquids ok until 0330 am   You may brush your teeth the morning of surgery.  Take these meds the morning of surgery with a sip of water: Thyroid  DO NOT wear jewelry, eye make-up, lipstick,body lotion, or dark fingernail polish.  (Polished toes are ok)   If you are to be admitted after surgery, leave suitcase in car until your room has been assigned.  Wear loose fitting, comfortable clothes for your ride home.

## 2012-06-29 NOTE — Pre-Procedure Instructions (Signed)
Discussed this patient with Dr. Malen Gauze- he is aware of pt's dx of POTS

## 2012-07-01 MED ORDER — DEXTROSE 5 % IV SOLN
2.0000 g | INTRAVENOUS | Status: AC
  Administered 2012-07-02: 2 g via INTRAVENOUS
  Filled 2012-07-01: qty 2

## 2012-07-01 NOTE — H&P (Addendum)
[facility]  [id number]  06/28/12 S: Lisa Rios goes by [employer].  She presents today for preop evaluation.  She has been having worsening problems with pelvic pain and dyspareunia.  She does have a history of a right ovarian cyst recurrence and also a history of abnormal uterine bleeding.  She had a recent hysteroscopy, D&C and NovaSure ablation for this with benign endometrial polyp.  At this time she desires definitive surgical intervention due to ongoing pain and presents for laparoscopically assisted vaginal hysterectomy.  She would also like removal of the right tube and ovary.  O: Physical exam:  Heart:  Regular rate and rhythm.  Lungs clear to auscultation bilaterally.  Abdomen is nondistended, nontender.  Well-healed Pfannenstiel incision and umbilical incision.  Abdomen is soft, nontender, nondistended.  Pelvic exam:  Uterus is anteverted, mobile and nontender.  No adnexal masses are palpable.  Cervix appears to be normal.  There is no significant rectocele or cystocele.  Past medical history:  The patient has history of migraines, irritable bowel syndrome, anemia, epilepsy.  She has POTS, anxiety disorder.  Previous surgeries:  She has had Cesarean sections, ovarian cyst removal, tubal ligation and NovaSure endometrial ablation.  Medications:  She is on Armor Thyroid, metformin, Adderall and a variety of different creams including iodine, iron, vitamin D, progesterone, testosterone, B12, methyl folate and vitamin B6.  She has an allergy to sulfa. A&P: Pelvic pain, abnormal uterine bleeding, recurrent right ovarian cysts, dyspareunia.  The patient desires definitive surgical intervention and requests hysterectomy and removal of the right tube and ovary.  Discussed laparoscopically assisted vaginal hysterectomy and right salpingo-oophorectomy at length.  Discussed the risks and benefits, pros and cons.  Discussed the fact that this may not alleviate the pain and that it could recur or worsen.  Discussed the  risks including but not limited to risk of infection, bleeding, damage to bowel, bladder, ureters, ovary, risks associated with anesthesia, blood transfusion.  Discussed the procedure at length.  She gives her informed consent and wishes to proceed. Dineen Kid Rana Snare, MD/rg  This patient has been seen and examined.   All of her questions were answered.  Labs and vital signs reviewed.  Informed consent has been obtained.  The History and Physical is current. 07/02/12 0715 DL

## 2012-07-02 ENCOUNTER — Observation Stay (HOSPITAL_COMMUNITY)
Admission: RE | Admit: 2012-07-02 | Discharge: 2012-07-03 | Disposition: A | Discharge status: Home / Self Care | Payer: BC Managed Care – PPO | Source: Ambulatory Visit | Attending: Obstetrics and Gynecology | Admitting: Obstetrics and Gynecology

## 2012-07-02 ENCOUNTER — Encounter (HOSPITAL_COMMUNITY): Payer: Self-pay | Admitting: Registered Nurse

## 2012-07-02 ENCOUNTER — Encounter (HOSPITAL_COMMUNITY): Payer: Self-pay | Admitting: Anesthesiology

## 2012-07-02 ENCOUNTER — Ambulatory Visit (HOSPITAL_COMMUNITY): Payer: BC Managed Care – PPO | Admitting: Anesthesiology

## 2012-07-02 ENCOUNTER — Encounter (HOSPITAL_COMMUNITY)
Admission: RE | Disposition: A | Discharge status: Home / Self Care | Payer: Self-pay | Source: Ambulatory Visit | Attending: Obstetrics and Gynecology

## 2012-07-02 DIAGNOSIS — N938 Other specified abnormal uterine and vaginal bleeding: Principal | ICD-10-CM | POA: Insufficient documentation

## 2012-07-02 DIAGNOSIS — N83209 Unspecified ovarian cyst, unspecified side: Secondary | ICD-10-CM | POA: Insufficient documentation

## 2012-07-02 DIAGNOSIS — Z90710 Acquired absence of both cervix and uterus: Secondary | ICD-10-CM

## 2012-07-02 DIAGNOSIS — N840 Polyp of corpus uteri: Secondary | ICD-10-CM | POA: Insufficient documentation

## 2012-07-02 DIAGNOSIS — IMO0002 Reserved for concepts with insufficient information to code with codable children: Secondary | ICD-10-CM | POA: Insufficient documentation

## 2012-07-02 DIAGNOSIS — N949 Unspecified condition associated with female genital organs and menstrual cycle: Secondary | ICD-10-CM | POA: Insufficient documentation

## 2012-07-02 HISTORY — PX: SALPINGOOPHORECTOMY: SHX82

## 2012-07-02 HISTORY — PX: LAPAROSCOPIC ASSISTED VAGINAL HYSTERECTOMY: SHX5398

## 2012-07-02 SURGERY — HYSTERECTOMY, VAGINAL, LAPAROSCOPY-ASSISTED
Anesthesia: General | Laterality: Right | Wound class: Clean Contaminated

## 2012-07-02 MED ORDER — HYDROMORPHONE HCL PF 1 MG/ML IJ SOLN: 0.2500 mg | INTRAMUSCULAR | Status: DC | PRN

## 2012-07-02 MED ORDER — ONDANSETRON HCL 4 MG/2ML IJ SOLN
INTRAMUSCULAR | Status: AC
  Filled 2012-07-02: qty 2

## 2012-07-02 MED ORDER — MENTHOL 3 MG MT LOZG: 1.0000 | LOZENGE | OROMUCOSAL | Status: DC | PRN

## 2012-07-02 MED ORDER — ROCURONIUM BROMIDE 50 MG/5ML IV SOLN
INTRAVENOUS | Status: AC
  Filled 2012-07-02: qty 1

## 2012-07-02 MED ORDER — SODIUM CHLORIDE 0.9 % IJ SOLN: 9.0000 mL | INTRAMUSCULAR | Status: DC | PRN

## 2012-07-02 MED ORDER — 0.9 % SODIUM CHLORIDE (POUR BTL) OPTIME
TOPICAL | Status: DC | PRN
  Administered 2012-07-02: 1000 mL

## 2012-07-02 MED ORDER — NEOSTIGMINE METHYLSULFATE 1 MG/ML IJ SOLN
INTRAMUSCULAR | Status: AC
  Filled 2012-07-02: qty 1

## 2012-07-02 MED ORDER — AMPHETAMINE-DEXTROAMPHET ER 15 MG PO CP24: 15.0000 mg | ORAL_CAPSULE | Freq: Every day | ORAL | Status: DC

## 2012-07-02 MED ORDER — MIDAZOLAM HCL 2 MG/2ML IJ SOLN: 0.5000 mg | Freq: Once | INTRAMUSCULAR | Status: DC | PRN

## 2012-07-02 MED ORDER — GLYCOPYRROLATE 0.2 MG/ML IJ SOLN
INTRAMUSCULAR | Status: DC | PRN
  Administered 2012-07-02: 0.4 mg via INTRAVENOUS

## 2012-07-02 MED ORDER — BUPIVACAINE HCL (PF) 0.25 % IJ SOLN
INTRAMUSCULAR | Status: DC | PRN
  Administered 2012-07-02: 10 mL

## 2012-07-02 MED ORDER — THYROID 60 MG PO TABS
60.0000 mg | ORAL_TABLET | Freq: Every day | ORAL | Status: DC
  Administered 2012-07-03: 60 mg via ORAL
  Filled 2012-07-02 (×2): qty 1

## 2012-07-02 MED ORDER — GLYCOPYRROLATE 0.2 MG/ML IJ SOLN
INTRAMUSCULAR | Status: AC
  Filled 2012-07-02: qty 2

## 2012-07-02 MED ORDER — ZOLPIDEM TARTRATE 5 MG PO TABS: 5.0000 mg | ORAL_TABLET | Freq: Every evening | ORAL | Status: DC | PRN

## 2012-07-02 MED ORDER — LIDOCAINE HCL (CARDIAC) 20 MG/ML IV SOLN
INTRAVENOUS | Status: AC
  Filled 2012-07-02: qty 5

## 2012-07-02 MED ORDER — PROPOFOL 10 MG/ML IV EMUL
INTRAVENOUS | Status: AC
  Filled 2012-07-02: qty 20

## 2012-07-02 MED ORDER — DIPHENHYDRAMINE HCL 12.5 MG/5ML PO ELIX: 12.5000 mg | ORAL_SOLUTION | Freq: Four times a day (QID) | ORAL | Status: DC | PRN

## 2012-07-02 MED ORDER — DEXTROSE-NACL 5-0.45 % IV SOLN
INTRAVENOUS | Status: DC
  Administered 2012-07-02 – 2012-07-03 (×3): via INTRAVENOUS

## 2012-07-02 MED ORDER — DEXAMETHASONE SODIUM PHOSPHATE 10 MG/ML IJ SOLN
INTRAMUSCULAR | Status: DC | PRN
  Administered 2012-07-02: 10 mg via INTRAVENOUS

## 2012-07-02 MED ORDER — LIDOCAINE HCL (CARDIAC) 20 MG/ML IV SOLN
INTRAVENOUS | Status: DC | PRN
  Administered 2012-07-02: 60 mg via INTRAVENOUS

## 2012-07-02 MED ORDER — KETOROLAC TROMETHAMINE 30 MG/ML IJ SOLN
30.0000 mg | Freq: Four times a day (QID) | INTRAMUSCULAR | Status: DC | PRN
  Administered 2012-07-02 – 2012-07-03 (×2): 30 mg via INTRAVENOUS
  Filled 2012-07-02 (×2): qty 1

## 2012-07-02 MED ORDER — NEOSTIGMINE METHYLSULFATE 1 MG/ML IJ SOLN
INTRAMUSCULAR | Status: DC | PRN
  Administered 2012-07-02: 2 mg via INTRAVENOUS

## 2012-07-02 MED ORDER — SCOPOLAMINE 1 MG/3DAYS TD PT72: 1.0000 | MEDICATED_PATCH | TRANSDERMAL | Status: DC

## 2012-07-02 MED ORDER — HYDROMORPHONE HCL PF 1 MG/ML IJ SOLN
INTRAMUSCULAR | Status: DC | PRN
  Administered 2012-07-02 (×2): 0.5 mg via INTRAVENOUS

## 2012-07-02 MED ORDER — LACTATED RINGERS IR SOLN
Status: DC | PRN
  Administered 2012-07-02: 3000 mL

## 2012-07-02 MED ORDER — BUPIVACAINE HCL (PF) 0.25 % IJ SOLN
INTRAMUSCULAR | Status: AC
  Filled 2012-07-02: qty 30

## 2012-07-02 MED ORDER — MIDAZOLAM HCL 2 MG/2ML IJ SOLN
INTRAMUSCULAR | Status: AC
  Filled 2012-07-02: qty 2

## 2012-07-02 MED ORDER — HYDROMORPHONE HCL PF 1 MG/ML IJ SOLN
INTRAMUSCULAR | Status: AC
  Filled 2012-07-02: qty 1

## 2012-07-02 MED ORDER — ROCURONIUM BROMIDE 100 MG/10ML IV SOLN
INTRAVENOUS | Status: DC | PRN
  Administered 2012-07-02: 50 mg via INTRAVENOUS

## 2012-07-02 MED ORDER — FENTANYL CITRATE 0.05 MG/ML IJ SOLN
INTRAMUSCULAR | Status: AC
  Filled 2012-07-02: qty 5

## 2012-07-02 MED ORDER — DIPHENHYDRAMINE HCL 50 MG/ML IJ SOLN
INTRAMUSCULAR | Status: AC
  Administered 2012-07-02: 12.5 mg via INTRAVENOUS
  Filled 2012-07-02: qty 1

## 2012-07-02 MED ORDER — DIPHENHYDRAMINE HCL 50 MG/ML IJ SOLN: 12.5000 mg | Freq: Four times a day (QID) | INTRAMUSCULAR | Status: DC | PRN

## 2012-07-02 MED ORDER — LACTATED RINGERS IV SOLN
INTRAVENOUS | Status: DC
  Administered 2012-07-02 (×2): via INTRAVENOUS

## 2012-07-02 MED ORDER — ONDANSETRON HCL 4 MG/2ML IJ SOLN: 4.0000 mg | Freq: Four times a day (QID) | INTRAMUSCULAR | Status: DC | PRN

## 2012-07-02 MED ORDER — DIPHENHYDRAMINE HCL 50 MG/ML IJ SOLN: 12.5000 mg | Freq: Once | INTRAMUSCULAR | Status: AC

## 2012-07-02 MED ORDER — MEPERIDINE HCL 25 MG/ML IJ SOLN: 6.2500 mg | INTRAMUSCULAR | Status: DC | PRN

## 2012-07-02 MED ORDER — HYDROMORPHONE 0.3 MG/ML IV SOLN
INTRAVENOUS | Status: DC
  Administered 2012-07-02: 0.6 mg via INTRAVENOUS
  Administered 2012-07-02: 11:00:00 via INTRAVENOUS
  Administered 2012-07-02 (×2): 2.5 mg via INTRAVENOUS
  Administered 2012-07-03 (×2): 0.399 mg via INTRAVENOUS
  Filled 2012-07-02: qty 25

## 2012-07-02 MED ORDER — PROMETHAZINE HCL 25 MG/ML IJ SOLN: 6.2500 mg | INTRAMUSCULAR | Status: DC | PRN

## 2012-07-02 MED ORDER — MIDAZOLAM HCL 5 MG/5ML IJ SOLN
INTRAMUSCULAR | Status: DC | PRN
  Administered 2012-07-02: 2 mg via INTRAVENOUS

## 2012-07-02 MED ORDER — PHENYLEPHRINE 40 MCG/ML (10ML) SYRINGE FOR IV PUSH (FOR BLOOD PRESSURE SUPPORT)
PREFILLED_SYRINGE | INTRAVENOUS | Status: AC
  Filled 2012-07-02: qty 5

## 2012-07-02 MED ORDER — NALOXONE HCL 0.4 MG/ML IJ SOLN: 0.4000 mg | INTRAMUSCULAR | Status: DC | PRN

## 2012-07-02 MED ORDER — PHENYLEPHRINE HCL 10 MG/ML IJ SOLN
INTRAMUSCULAR | Status: DC | PRN
  Administered 2012-07-02: 80 ug via INTRAVENOUS

## 2012-07-02 MED ORDER — HYDROMORPHONE HCL PF 1 MG/ML IJ SOLN: 0.2000 mg | INTRAMUSCULAR | Status: DC | PRN

## 2012-07-02 MED ORDER — DEXAMETHASONE SODIUM PHOSPHATE 10 MG/ML IJ SOLN
INTRAMUSCULAR | Status: AC
  Filled 2012-07-02: qty 1

## 2012-07-02 MED ORDER — PROPOFOL 10 MG/ML IV BOLUS
INTRAVENOUS | Status: DC | PRN
  Administered 2012-07-02: 150 mg via INTRAVENOUS

## 2012-07-02 MED ORDER — SCOPOLAMINE 1 MG/3DAYS TD PT72
MEDICATED_PATCH | TRANSDERMAL | Status: DC
Start: 2012-07-02 — End: 2012-07-03
  Administered 2012-07-02: 1.5 mg via TRANSDERMAL
  Filled 2012-07-02: qty 1

## 2012-07-02 MED ORDER — KETOROLAC TROMETHAMINE 30 MG/ML IJ SOLN: 15.0000 mg | Freq: Once | INTRAMUSCULAR | Status: DC | PRN

## 2012-07-02 MED ORDER — FENTANYL CITRATE 0.05 MG/ML IJ SOLN
INTRAMUSCULAR | Status: DC | PRN
  Administered 2012-07-02: 100 ug via INTRAVENOUS
  Administered 2012-07-02 (×3): 50 ug via INTRAVENOUS

## 2012-07-02 MED ORDER — OXYCODONE-ACETAMINOPHEN 5-325 MG PO TABS
1.0000 | ORAL_TABLET | ORAL | Status: DC | PRN
  Administered 2012-07-03 (×2): 1 via ORAL
  Filled 2012-07-02 (×2): qty 1

## 2012-07-02 MED ORDER — IBUPROFEN 600 MG PO TABS: 600.0000 mg | ORAL_TABLET | Freq: Four times a day (QID) | ORAL | Status: DC | PRN

## 2012-07-02 MED ORDER — ONDANSETRON HCL 4 MG/2ML IJ SOLN
INTRAMUSCULAR | Status: DC | PRN
  Administered 2012-07-02: 4 mg via INTRAVENOUS

## 2012-07-02 SURGICAL SUPPLY — 41 items
ADH SKN CLS APL DERMABOND .7 (GAUZE/BANDAGES/DRESSINGS) ×2
BLADE SURG 15 STRL LF C SS BP (BLADE) ×2 IMPLANT
BLADE SURG 15 STRL SS (BLADE) ×3
CABLE HIGH FREQUENCY MONO STRZ (ELECTRODE) IMPLANT
CATH ROBINSON RED A/P 16FR (CATHETERS) ×3 IMPLANT
CLOTH BEACON ORANGE TIMEOUT ST (SAFETY) ×3 IMPLANT
CONT PATH 16OZ SNAP LID 3702 (MISCELLANEOUS) ×3 IMPLANT
COVER TABLE BACK 60X90 (DRAPES) ×3 IMPLANT
DECANTER SPIKE VIAL GLASS SM (MISCELLANEOUS) IMPLANT
DERMABOND ADVANCED (GAUZE/BANDAGES/DRESSINGS) ×1
DERMABOND ADVANCED .7 DNX12 (GAUZE/BANDAGES/DRESSINGS) ×2 IMPLANT
ELECT LIGASURE LONG (ELECTRODE) ×3 IMPLANT
ELECT REM PT RETURN 9FT ADLT (ELECTROSURGICAL)
ELECTRODE REM PT RTRN 9FT ADLT (ELECTROSURGICAL) IMPLANT
FORCEPS CUTTING 45CM 5MM (CUTTING FORCEPS) ×3 IMPLANT
GLOVE BIO SURGEON STRL SZ8 (GLOVE) ×3 IMPLANT
GLOVE BIOGEL PI IND STRL 6.5 (GLOVE) ×2 IMPLANT
GLOVE BIOGEL PI INDICATOR 6.5 (GLOVE) ×1
GLOVE SURG ORTHO 8.0 STRL STRW (GLOVE) ×9 IMPLANT
GOWN STRL REIN XL XLG (GOWN DISPOSABLE) ×12 IMPLANT
NEEDLE INSUFFLATION 120MM (ENDOMECHANICALS) ×3 IMPLANT
NS IRRIG 1000ML POUR BTL (IV SOLUTION) ×3 IMPLANT
PACK LAVH (CUSTOM PROCEDURE TRAY) ×3 IMPLANT
PROTECTOR NERVE ULNAR (MISCELLANEOUS) ×3 IMPLANT
SET IRRIG TUBING LAPAROSCOPIC (IRRIGATION / IRRIGATOR) IMPLANT
SOLUTION ELECTROLUBE (MISCELLANEOUS) IMPLANT
STRIP CLOSURE SKIN 1/4X3 (GAUZE/BANDAGES/DRESSINGS) IMPLANT
SUT MNCRL 0 MO-4 VIOLET 18 CR (SUTURE) ×4 IMPLANT
SUT MNCRL 0 VIOLET 6X18 (SUTURE) ×2 IMPLANT
SUT MNCRL AB 0 CT1 27 (SUTURE) IMPLANT
SUT MON AB 2-0 CT1 36 (SUTURE) IMPLANT
SUT MONOCRYL 0 6X18 (SUTURE) ×1
SUT MONOCRYL 0 MO 4 18  CR/8 (SUTURE) ×2
SUT VICRYL 0 UR6 27IN ABS (SUTURE) ×3 IMPLANT
SUT VICRYL RAPIDE 3 0 (SUTURE) ×3 IMPLANT
TOWEL OR 17X24 6PK STRL BLUE (TOWEL DISPOSABLE) ×6 IMPLANT
TRAY FOLEY CATH 14FR (SET/KITS/TRAYS/PACK) ×3 IMPLANT
TROCAR OPTI TIP 5M 100M (ENDOMECHANICALS) ×3 IMPLANT
TROCAR XCEL DIL TIP R 11M (ENDOMECHANICALS) ×3 IMPLANT
WARMER LAPAROSCOPE (MISCELLANEOUS) ×3 IMPLANT
WATER STERILE IRR 1000ML POUR (IV SOLUTION) ×3 IMPLANT

## 2012-07-02 NOTE — Anesthesia Postprocedure Evaluation (Signed)
  Anesthesia Post-op Note  Patient: Lisa Rios  Procedure(s) Performed: Procedure(s): LAPAROSCOPIC ASSISTED VAGINAL HYSTERECTOMY (N/A) SALPINGO OOPHORECTOMY (Right)  Patient is awake and responsive. Pain and nausea are reasonably well controlled. Vital signs are stable and clinically acceptable. Oxygen saturation is clinically acceptable. There are no apparent anesthetic complications at this time. Patient is ready for discharge.

## 2012-07-02 NOTE — Anesthesia Preprocedure Evaluation (Addendum)
Anesthesia Evaluation  Patient identified by MRN, date of birth, ID band Patient awake    Reviewed: Allergy & Precautions, H&P , Patient's Chart, lab work & pertinent test results, reviewed documented beta blocker date and time   History of Anesthesia Complications Negative for: history of anesthetic complications  Airway Mallampati: II TM Distance: >3 FB Neck ROM: full    Dental no notable dental hx.    Pulmonary neg pulmonary ROS,  breath sounds clear to auscultation  Pulmonary exam normal       Cardiovascular Exercise Tolerance: Good negative cardio ROS  Rhythm:regular Rate:Normal     Neuro/Psych  Headaches, PSYCHIATRIC DISORDERS negative neurological ROS  negative psych ROS   GI/Hepatic negative GI ROS, Neg liver ROS, GERD-  ,  Endo/Other  negative endocrine ROS  Renal/GU negative Renal ROS     Musculoskeletal   Abdominal   Peds  Hematology negative hematology ROS (+) anemia ,   Anesthesia Other Findings Thyroid disease     Hypotension 2011      POTS (postural orthostatic tachycardia syndrome) 2011   Shortness of breath   related to POTS    GERD (gastroesophageal reflux disease)     Headache(784.0)        CFS (chronic fatigue syndrome)      Reproductive/Obstetrics negative OB ROS                           Anesthesia Physical Anesthesia Plan  ASA: III  Anesthesia Plan: General ETT   Post-op Pain Management:    Induction:   Airway Management Planned:   Additional Equipment:   Intra-op Plan:   Post-operative Plan:   Informed Consent: I have reviewed the patients History and Physical, chart, labs and discussed the procedure including the risks, benefits and alternatives for the proposed anesthesia with the patient or authorized representative who has indicated his/her understanding and acceptance.   Dental Advisory Given  Plan Discussed with: CRNA and  Surgeon  Anesthesia Plan Comments:         Anesthesia Quick Evaluation Only a line if patient has hemodynamic variability... Will check BS for hypoglycemia in the past

## 2012-07-02 NOTE — Anesthesia Postprocedure Evaluation (Signed)
  Anesthesia Post-op Note  Patient: Lisa Rios  Procedure(s) Performed: Procedure(s): LAPAROSCOPIC ASSISTED VAGINAL HYSTERECTOMY (N/A) SALPINGO OOPHORECTOMY (Right)  Patient Location: [facility]  Anesthesia Type:General  Level of Consciousness: awake, alert  and oriented  Airway and Oxygen Therapy: Patient Spontanous Breathing and Patient connected to nasal cannula oxygen  Post-op Pain: mild  Post-op Assessment: Post-op Vital signs reviewed and Patient's Cardiovascular Status Stable  Post-op Vital Signs: Reviewed and stable  Complications: No apparent anesthesia complications

## 2012-07-02 NOTE — Preoperative (Signed)
Beta Blockers   Reason not to administer Beta Blockers:Not Applicable 

## 2012-07-02 NOTE — Op Note (Signed)
Lisa Rios, Lisa Rios            ACCOUNT NO.:  [id number]  MEDICAL RECORD NO.:  [id number]  LOCATION:  [facility]                          FACILITY:  [facility]  PHYSICIAN:  Dineen Kid. Rana Snare, M.D.    DATE OF BIRTH:  11/26/1970  DATE OF PROCEDURE:  07/02/2012 DATE OF DISCHARGE:                              OPERATIVE REPORT   PREOPERATIVE DIAGNOSES:  Abnormal uterine bleeding, pelvic pain, dyspareunia, recurrent bladder and ovarian cyst.  POSTOPERATIVE DIAGNOSES:  Abnormal uterine bleeding, pelvic pain, dyspareunia, recurrent bladder and ovarian cyst, and pelvic adhesions.  PROCEDURE:  Laparoscopic-assisted vaginal hysterectomy, right salpingo- oophorectomy, and lysis of adhesions.  SURGEON:  Dineen Kid. Rana Snare, M.D.  ASSISTANTSu Monks  ANESTHESIA:  General endotracheal.  INDICATIONS:  Lisa Rios, 42 year old, G2, P2, with worsening problems with abnormal bleeding, status post hysteroscopy, D and C for benign endometrial polyp and ablation, continued to have pelvic pain not only with intercourse, but on a regular basis.  She has a history of recurrent right ovarian cyst at this time.  She desires definitive surgical intervention, requests hysterectomy.  She also desires removal of the right tube and ovary.  Discussed the risks, benefits, and procedure at length which include, but not limited to risk of infection, bleeding, damage to bowel, bladder, ureters, possibility that it may not alleviate the pain, it could recur or worsen.  She does give her informed consent and wished to proceed.  FINDINGS:  At time of surgery, small cyst on the right ovary, pelvic adhesions from the bowel to the uterus along the posterior wall and also adhesions of the left tubo-ovarian complex to the left sidewall.  Normal- appearing appendix.  Normal-appearing liver, otherwise normal-appearing uterus.  DESCRIPTION OF PROCEDURE:  After adequate anesthesia, the patient was placed in dorsal lithotomy position.   She is sterilely prepped and draped.  Bladder sterilely drained.  Graves speculum was placed.  The Cohen tenaculum placed on the cervix.  A 1 cm infraumbilical skin incision was made.  A Veress needle was inserted.  Abdomen was insufflated, dullness to percussion.  11mm trocar was inserted. The laparoscope was inserted.   A second port was placed with a 5 mm trocar, closed the midline 2 fingerbreadths beneath the pubic symphysis through the previous Pfannenstiel skin incision. After careful systematic evaluation of the pelvis, the Gyrus cutting forceps used to ligate across the adhesions achieving good hemostasis with care taken to avoid the  underlying bowel, The right infundibulopelvic ligament was identified.  Gyrus was used to ligate and dissect across the IFP with the right tube and ovary falling towards the uterus, this was taken down across the round ligament down just to the inferior portion of the broad ligament.  The left utero- ovarian ligament was then ligated and dissected across the round ligament with the left tubo-ovarian complex falling towards the sidewall noting some adhesions noted to the pelvic sidewall, but otherwise normal-appearing ovary and distal end of the fallopian tube.  The bladder was then elevated and small bladder flap was created that was then deflated.  Legs were repositioned.  Weighted speculum placed in the vagina.  Posterior colpotomy was performed.  The cervix was circumscribed with Bovie cautery.  Uterosacral  ligaments were ligated with the LigaSure instrument, dissected with Mayo scissors.  LigaSure was used to ligate across the bladder pillars.  The anterior vaginal mucosa was then dissected below the bladder flap with the Deaver retractor placed below the bladder.  The uterine vasculature was ligated with LigaSure and was then dissected with Mayo scissors up to the inferior portions of the broad ligament.  The uterus was then removed with the  right tube and ovary intact.  A small packing was placed.  Uterosacral ligaments were identified, suture ligated with figure-of-eight of 0 Monocryl suture.  Posterior peritoneum was then closed in a pursestring fashion 0 Monocryl suture.  The packing was then removed.  The vagina was then closed in a vertical fashion plicating the uterosacral ligaments in the midline with 0 Monocryl interrupted suture. It was closed in a vertical fashion with good approximation, good hemostasis was noted with figure-of-eight sutures then cut, good support and hemostasis was noted.  Foley catheter was placed in the bladder with return of clear yellow urine.  Legs repositioned.  Abdomen reinsufflated.  Nissan suction irrigator was used to irrigate the pelvis.  Examination of the pelvis revealed good hemostasis.  The ureter was identified bilaterally with good peristalsis noted and after copious amount of irrigation, adequate hemostasis was assured.  The trocars were removed.  Abdomen desufflated.  The infraumbilical skin incision was closed with 0 Vicryl interrupted suture, the fascia with 3- 0 Vicryl repeat subcuticular suture, 5 mm site was closed with 3-0 Vicryl repeat subcuticular suture and Dermabond.  The incisions were injected with 0.25% Marcaine, total 10 mL used.  The patient then transferred to the recovery room in stable condition.  Sponge and instrument count was normal x3.  Estimated blood loss 200 mL.  Patient received 1 g of cefotetan preoperatively.     Dineen Kid Rana Snare, M.D.     DCL/MEDQ  D:  07/02/2012  T:  07/02/2012  Job:  [id number]

## 2012-07-02 NOTE — Transfer of Care (Signed)
Immediate Anesthesia Transfer of Care Note  Patient: Lisa Rios  Procedure(s) Performed: Procedure(s): LAPAROSCOPIC ASSISTED VAGINAL HYSTERECTOMY (N/A) SALPINGO OOPHORECTOMY (Right)  Patient Location: PACU  Anesthesia Type:General  Level of Consciousness: awake, alert  and oriented  Airway & Oxygen Therapy: Patient Spontanous Breathing and Patient connected to nasal cannula oxygen  Post-op Assessment: Report given to PACU RN  Post vital signs: Reviewed  Complications: No apparent anesthesia complications

## 2012-07-02 NOTE — Brief Op Note (Signed)
07/02/2012  9:01 AM  PATIENT:  Lisa Rios  42 y.o. female  PRE-OPERATIVE DIAGNOSIS:  Pelvic Pain, AUB, dysparunia, recurrent right ovarian cysts  POST-OPERATIVE DIAGNOSIS:  Same plus pelvic adhesions  PROCEDURE:  Procedure(s): LAPAROSCOPIC ASSISTED VAGINAL HYSTERECTOMY (N/A) SALPINGO OOPHORECTOMY (Right), LOA  SURGEON:  Surgeon(s) and Role:    * Turner Daniels, MD - Primary    * Mitchel Honour, DO - Assisting  PHYSICIAN ASSISTANT:   ASSISTANTS: morris   ANESTHESIA:   general  EBL:  Total I/O In: 1500 [I.V.:1500] Out: 600 [Urine:400; Blood:200]  BLOOD ADMINISTERED:none  DRAINS: Urinary Catheter (Foley)   LOCAL MEDICATIONS USED:  BUPIVICAINE   SPECIMEN:  Source of Specimen:  uterus, right ovary  DISPOSITION OF SPECIMEN:  PATHOLOGY  COUNTS:  YES  TOURNIQUET:  * No tourniquets in log *  DICTATION: .Other Dictation: Dictation Number 1  PLAN OF CARE: Admit for overnight observation  PATIENT DISPOSITION:  PACU - hemodynamically stable.   Delay start of Pharmacological VTE agent (>24hrs) due to surgical blood loss or risk of bleeding: not applicable

## 2012-07-03 ENCOUNTER — Encounter (HOSPITAL_COMMUNITY): Payer: Self-pay | Admitting: Obstetrics and Gynecology

## 2012-07-03 LAB — CBC
HCT: 30.7 % — ABNORMAL LOW (ref 36.0–46.0)
Hemoglobin: 10.6 g/dL — ABNORMAL LOW (ref 12.0–15.0)
MCH: 29.7 pg (ref 26.0–34.0)
MCV: 86 fL (ref 78.0–100.0)
Platelets: 160 10*3/uL (ref 150–400)
RBC: 3.57 MIL/uL — ABNORMAL LOW (ref 3.87–5.11)
WBC: 11.4 10*3/uL — ABNORMAL HIGH (ref 4.0–10.5)

## 2012-07-03 MED ORDER — OXYCODONE-ACETAMINOPHEN 5-325 MG PO TABS: 1.0000 | ORAL_TABLET | ORAL | Status: DC | PRN

## 2012-07-03 MED ORDER — IBUPROFEN 600 MG PO TABS: 600.0000 mg | ORAL_TABLET | Freq: Four times a day (QID) | ORAL | Status: DC | PRN

## 2012-07-03 NOTE — Progress Notes (Signed)
1 Day Post-Op Procedure(s) (LRB): LAPAROSCOPIC ASSISTED VAGINAL HYSTERECTOMY (N/A) SALPINGO OOPHORECTOMY (Right)  Subjective: Patient reports tolerating PO and no problems voiding.    Objective: I have reviewed patient's vital signs, intake and output and labs.  General: alert, cooperative, appears stated age and no distress GI: soft, non-tender; bowel sounds normal; no masses,  no organomegaly and incision: clean, dry and intact  Assessment: s/p Procedure(s): LAPAROSCOPIC ASSISTED VAGINAL HYSTERECTOMY (N/A) SALPINGO OOPHORECTOMY (Right): stable and progressing well  Plan: Advance diet Advance to PO medication Discontinue IV fluids Discharge home  LOS: 1 day    Jahzara Slattery C 07/03/2012, 9:02 AM

## 2012-07-03 NOTE — Discharge Summary (Signed)
Physician Discharge Summary  Patient ID: Lisa Rios MRN: [id number] DOB/AGE: 04-19-1970 42 y.o.  Admit date: 07/02/2012 Discharge date: 07/03/2012  Admission Diagnoses:  Discharge Diagnoses:  Active Problems:   * No active hospital problems. *   Discharged Condition: good  Hospital Course: Pt underwent uncomplicated LAVH with RSO and LOA.  EBL 200cc and her post op course was unremarkable with return of bowel function, tolerating po diet and meds and ambulating without difficulty on POD 1  Consults: None  Significant Diagnostic Studies: labs: Hgb 10.6 POD1  Treatments: surgery: LAVH, RSO, LOA  Discharge Exam: Blood pressure 81/50, pulse 62, temperature 98.2 F (36.8 C), temperature source Oral, resp. rate 16, height 5\' 6"  (1.676 m), weight 57.153 kg (126 lb), SpO2 100.00%. General appearance: alert, cooperative, appears stated age and no distress GI: soft, non-tender; bowel sounds normal; no masses,  no organomegaly Incision/Wound:CD&I  Disposition: 01-Home or Self Care  Discharge Orders   Future Orders Complete By Expires     Call MD for:  difficulty breathing, headache or visual disturbances  As directed     Call MD for:  persistant nausea and vomiting  As directed     Call MD for:  redness, tenderness, or signs of infection (pain, swelling, redness, odor or green/yellow discharge around incision site)  As directed     Call MD for:  severe uncontrolled pain  As directed     Call MD for:  temperature >100.4  As directed     Diet general  As directed     Discharge instructions  As directed     Comments:      Follow up in office in 2 weeks as scheduled    Driving Restrictions  As directed     Comments:      No driving for 2 weeks    Increase activity slowly  As directed     Lifting restrictions  As directed     Comments:      No lifting anything greater than 10 pounds (if you have to ask, don't lift it)    Sexual Activity Restrictions  As directed     Comments:      Nothing in the vagina for 6 weeks        Medication List    TAKE these medications       amphetamine-dextroamphetamine 15 MG 24 hr capsule  Commonly known as:  ADDERALL XR  Take 15 mg by mouth daily. To keep blood pressure up for POTS syndrome     B-12 5000 MCG Tbdp  Take 1 tablet by mouth daily.     ibuprofen 600 MG tablet  Commonly known as:  ADVIL,MOTRIN  Take 1 tablet (600 mg total) by mouth every 6 (six) hours as needed (mild pain).     ibuprofen 200 MG tablet  Commonly known as:  ADVIL,MOTRIN  Take 200 mg by mouth every 6 (six) hours as needed. For pain     IRON PO  Take 1 tablet by mouth daily.     metFORMIN 500 MG 24 hr tablet  Commonly known as:  GLUCOPHAGE-XR  Take 500 mg by mouth 2 (two) times daily before a meal.     OVER THE COUNTER MEDICATION  Take 1 tablet by mouth daily. Progesterone tablet - from [facility]     OVER THE COUNTER MEDICATION  Take 1 tablet by mouth daily. Iodine - from [facility]     OVER THE COUNTER MEDICATION  Take 1 tablet by mouth daily. Methylfolate 2500 mcg daily - from [facility]     oxyCODONE-acetaminophen 5-325 MG per tablet  Commonly known as:  PERCOCET/ROXICET  Take 1-2 tablets by mouth every 4 (four) hours as needed.     pyridOXINE 50 MG tablet  Commonly known as:  VITAMIN B-6  Take 50 mg by mouth daily.     thyroid 60 MG tablet  Commonly known as:  ARMOUR  Take 60 mg by mouth daily.     Vitamin D3 10000 UNITS capsule  Take 10,000 Units by mouth daily.         Signed: Maleki Hippe C 07/03/2012, 9:06 AM

## 2012-07-03 NOTE — Progress Notes (Signed)
Pt. Is discharged in the care of husband. Downstairs per ambulatory. Denies any pain or discomfort, heavy vaginal bleeding, or temp. Discharge instructions with Rx were given to pt. Pt understands all instructions well. Stable.

## 2013-01-29 ENCOUNTER — Other Ambulatory Visit (HOSPITAL_COMMUNITY): Payer: Self-pay | Admitting: *Deleted

## 2013-01-30 ENCOUNTER — Encounter (HOSPITAL_COMMUNITY)
Admission: RE | Admit: 2013-01-30 | Discharge: 2013-01-30 | Disposition: A | Discharge status: Home / Self Care | Payer: Medicaid Other | Source: Ambulatory Visit | Attending: Internal Medicine | Admitting: Internal Medicine

## 2013-01-30 DIAGNOSIS — I951 Orthostatic hypotension: Secondary | ICD-10-CM | POA: Insufficient documentation

## 2013-01-30 DIAGNOSIS — E2749 Other adrenocortical insufficiency: Secondary | ICD-10-CM | POA: Insufficient documentation

## 2013-01-30 MED ORDER — SODIUM CHLORIDE 0.9 % IV SOLN
Freq: Once | INTRAVENOUS | Status: AC
  Administered 2013-01-30: 09:00:00 via INTRAVENOUS

## 2013-01-30 MED ORDER — COSYNTROPIN 0.25 MG IJ SOLR
0.2500 mg | Freq: Once | INTRAMUSCULAR | Status: AC
  Administered 2013-01-30: 0.25 mg via INTRAVENOUS
  Filled 2013-01-30: qty 0.25

## 2013-01-31 LAB — ACTH STIMULATION, 3 TIME POINTS
CORTISOL BASE: 14.9 ug/dL
Cortisol, 30 Min: 20.3 ug/dL (ref >20.0)
Cortisol, 60 Min: 21.9 ug/dL (ref >20)

## 2013-01-31 LAB — ACTH: C206 ACTH: 13 pg/mL (ref 10–46)

## 2013-02-06 ENCOUNTER — Telehealth (HOSPITAL_COMMUNITY): Payer: Self-pay | Admitting: Cardiac Rehabilitation

## 2013-02-06 NOTE — Telephone Encounter (Signed)
pc to pt inform awaiting medicaid order from referring physician Lisa Rios for cardiac rehab, however pt diagnosis may not qualify for medicaid cardiac rehab criteria. Pt states she has been in conversation with her physician about medicaid not covering her diagnosis for cardiac rehab.  Pt given information about cardiac maintenance program. Pt verbalized understanding and states she will exercise on her own.

## 2013-05-19 IMAGING — CT CT ABD-PELV W/ CM
2 of 4 series · 12 of 36 positions shown, 19 images · IV contrast ([ID] [id number])
Comparison: None.

CT CHEST

CLINICAL DATA: Fever

CT CHEST, ABDOMEN AND PELVIS WITH CONTRAST
TECHNIQUE: Multidetector CT imaging of the chest, abdomen and
pelvis was performed following the standard protocol during bolus
administration of intravenous contrast.
Contrast: 100mL OMNIPAQUE IOHEXOL 300 MG/ML  SOLN,

[Series 601: coronal body · coronal · 1.21mm/px · 1 of 111 slices shown, 2 images]
[im 37/111  soft-tissue]
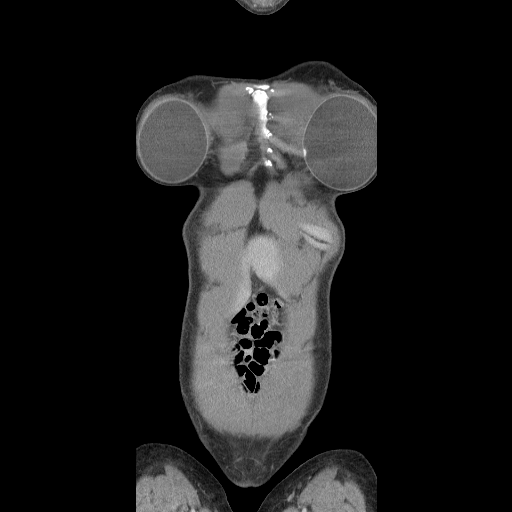
[im 37/111  bone]
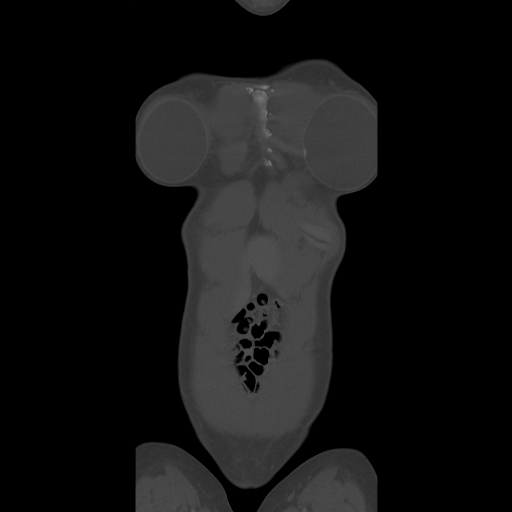

[Series 602: sagittal body · sagittal · 1.21mm/px · 11 of 133 slices shown, 17 images]
[im 11/133  soft-tissue]
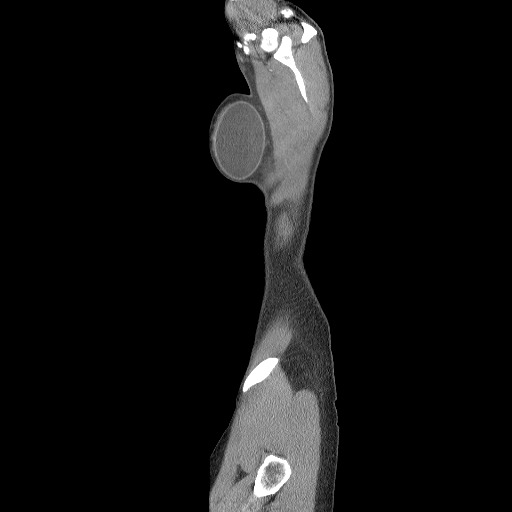
[im 11/133  lung]
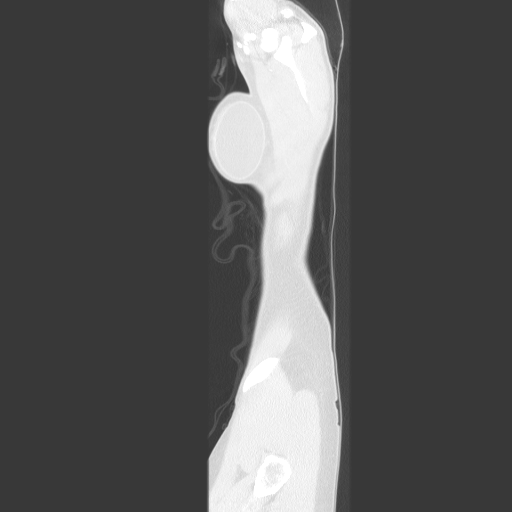
[im 11/133  bone]
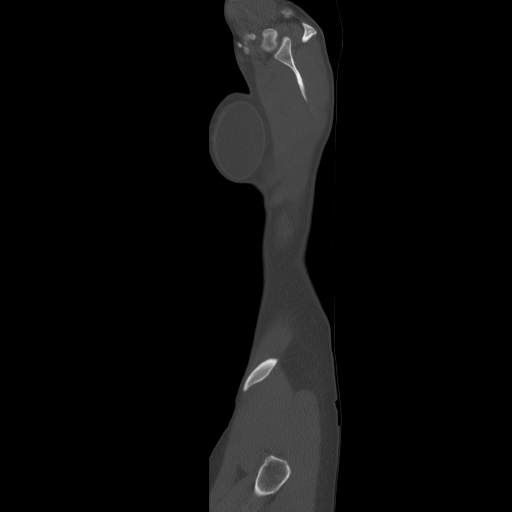
[im 21/133  soft-tissue]
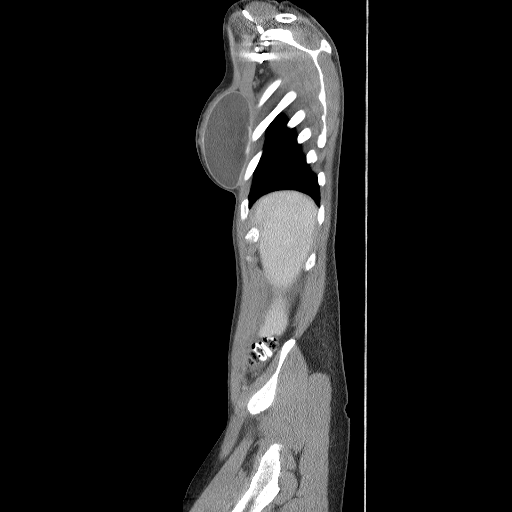
[im 21/133  lung]
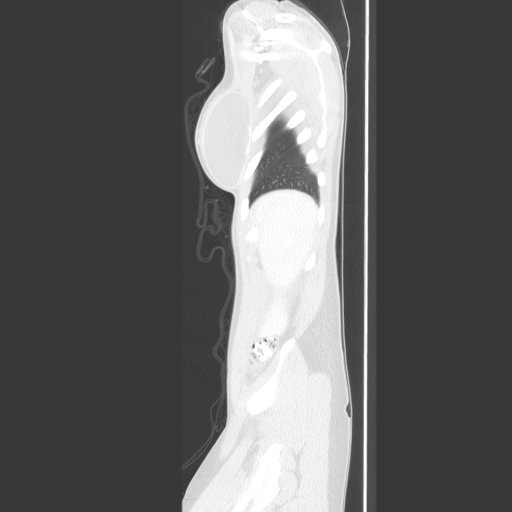
[im 31/133  soft-tissue]
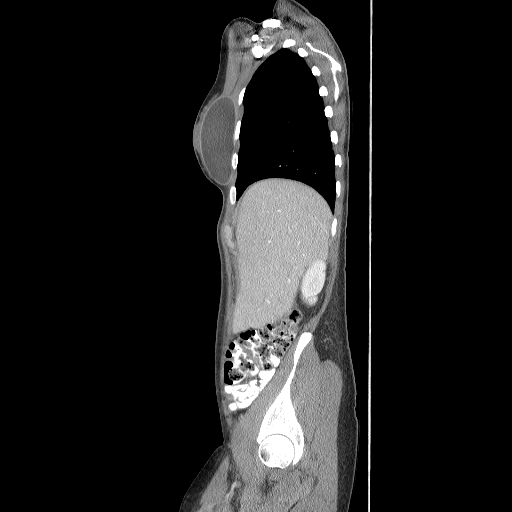
[im 31/133  lung]
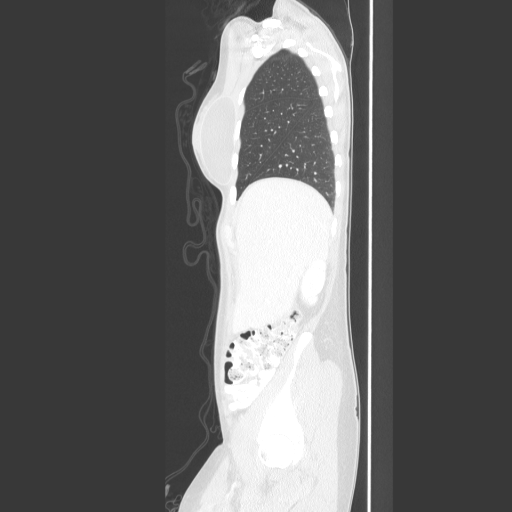
[im 41/133  soft-tissue]
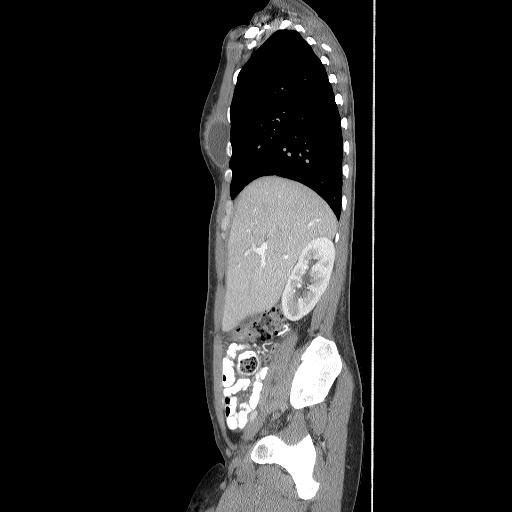
[im 41/133  lung]
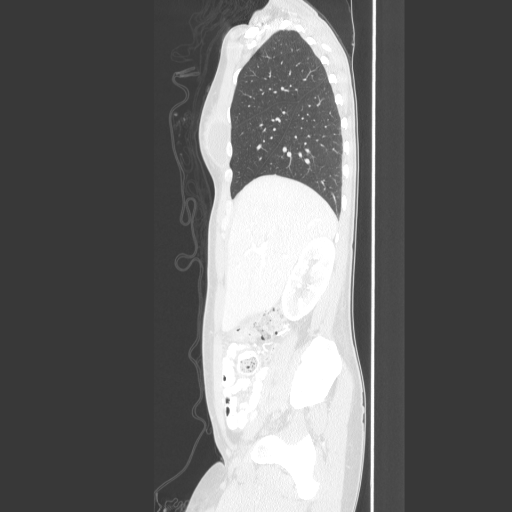
[im 51/133  soft-tissue]
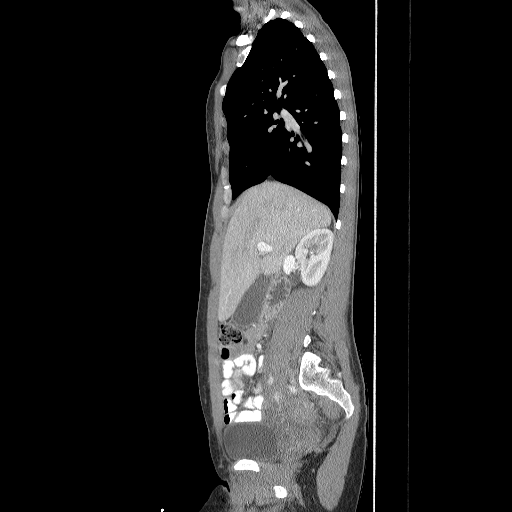
[im 72/133  soft-tissue]
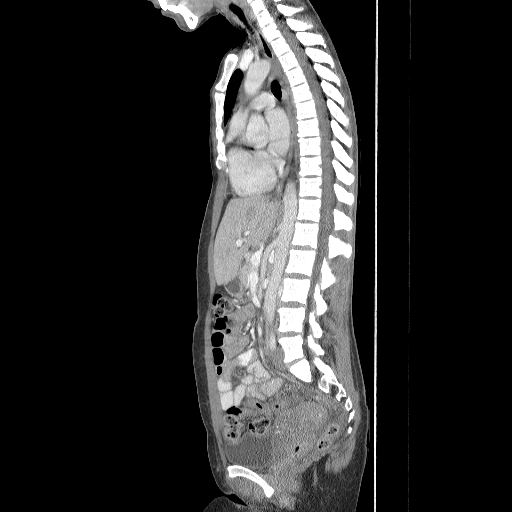
[im 82/133  soft-tissue]
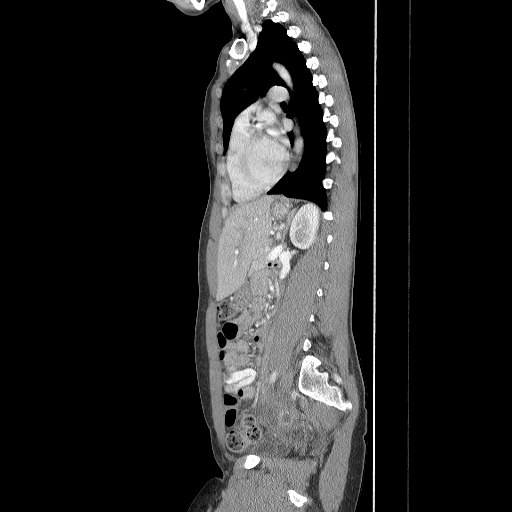
[im 92/133  soft-tissue]
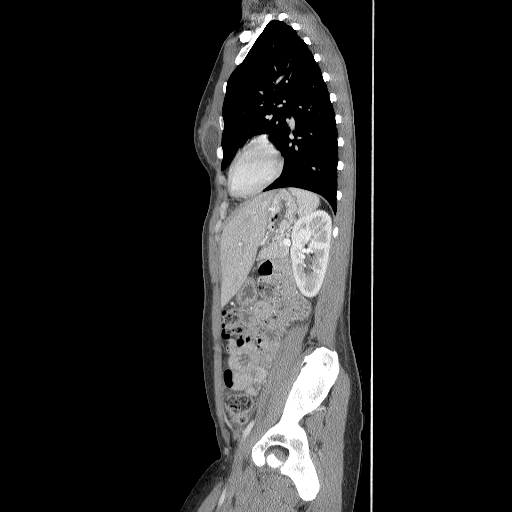
[im 102/133  soft-tissue]
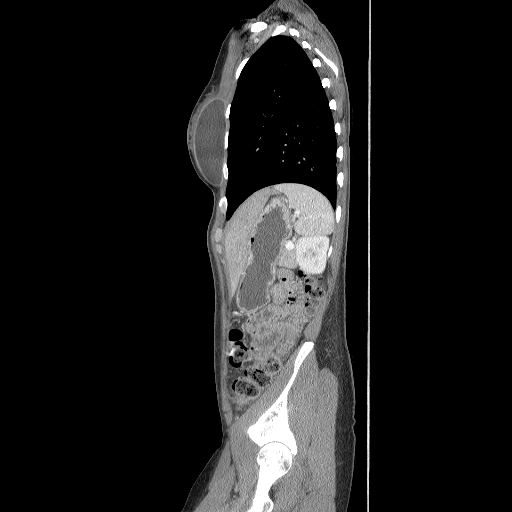
[im 102/133  bone]
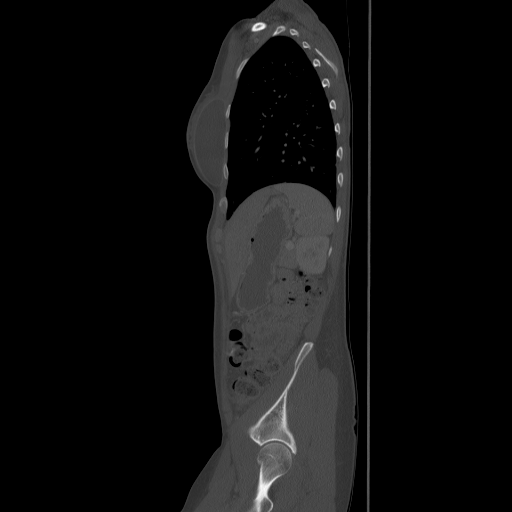
[im 112/133  soft-tissue]
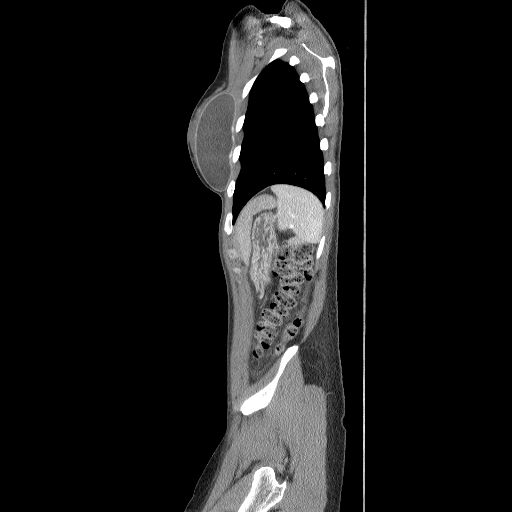
[im 122/133  soft-tissue]
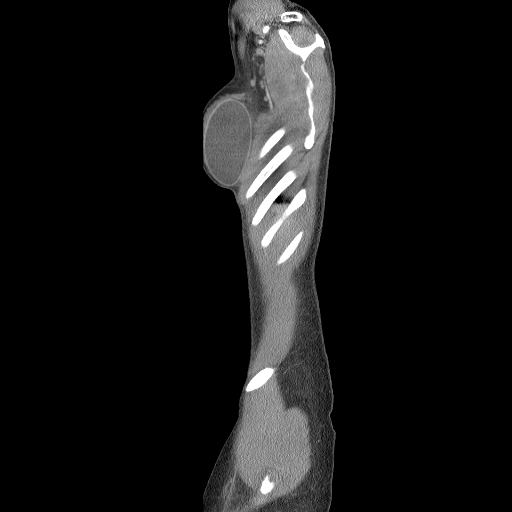

[12 of 36 positions shown; findings below may reference images not displayed]

FINDINGS: Negative abnormal mediastinal adenopathy or pericardial
effusion.

No pneumothorax.  No pleural effusion.

Clear lungs.

No destructive bone lesion.
IMPRESSION: No acute cardiopulmonary disease.

CT ABDOMEN AND PELVIS
FINDINGS: Liver, gallbladder, spleen, pancreas, adrenal glands are
within normal limits.

Tiny calculus in the upper pole of the right kidney.  Unremarkable
left kidney.

The uterus is prominent and retroverted.  It is also heterogeneous
in enhancement.  Cystic changes in both ovaries are present.

Small amount of free fluid is seen layering in the pelvis as well
as about the liver.

Unremarkable bladder.  Normal appendix.

 Circumaortic left renal vein anatomy.
IMPRESSION: Heterogeneous and prominent uterus.  This may be due to fibroids.
Ultrasound may be helpful.  Cystic changes in the ovaries are noted
likely due to collapsing functional cyst.

Small amount of free fluid in the pelvis and about the liver
nonspecific.

Right nephrolithiasis.

## 2014-09-01 ENCOUNTER — Other Ambulatory Visit: Payer: Self-pay | Admitting: Obstetrics and Gynecology

## 2014-09-02 LAB — CYTOLOGY - PAP

## 2015-10-22 DIAGNOSIS — E039 Hypothyroidism, unspecified: Secondary | ICD-10-CM | POA: Diagnosis not present

## 2015-10-29 DIAGNOSIS — E039 Hypothyroidism, unspecified: Secondary | ICD-10-CM | POA: Diagnosis not present

## 2015-10-29 DIAGNOSIS — G629 Polyneuropathy, unspecified: Secondary | ICD-10-CM | POA: Diagnosis not present

## 2015-10-29 DIAGNOSIS — R634 Abnormal weight loss: Secondary | ICD-10-CM | POA: Diagnosis not present

## 2015-11-25 DIAGNOSIS — Z23 Encounter for immunization: Secondary | ICD-10-CM | POA: Diagnosis not present

## 2015-12-21 ENCOUNTER — Ambulatory Visit (INDEPENDENT_AMBULATORY_CARE_PROVIDER_SITE_OTHER): Payer: BLUE CROSS/BLUE SHIELD | Admitting: Neurology

## 2015-12-21 ENCOUNTER — Encounter: Payer: Self-pay | Admitting: Neurology

## 2015-12-21 VITALS — BP 90/54 | HR 62 | Resp 14 | Wt 116.0 lb

## 2015-12-21 DIAGNOSIS — G4700 Insomnia, unspecified: Secondary | ICD-10-CM | POA: Diagnosis not present

## 2015-12-21 DIAGNOSIS — R55 Syncope and collapse: Secondary | ICD-10-CM

## 2015-12-21 DIAGNOSIS — I951 Orthostatic hypotension: Secondary | ICD-10-CM | POA: Diagnosis not present

## 2015-12-21 DIAGNOSIS — G47411 Narcolepsy with cataplexy: Secondary | ICD-10-CM | POA: Diagnosis not present

## 2015-12-21 DIAGNOSIS — I498 Other specified cardiac arrhythmias: Secondary | ICD-10-CM

## 2015-12-21 DIAGNOSIS — R000 Tachycardia, unspecified: Secondary | ICD-10-CM | POA: Diagnosis not present

## 2015-12-21 DIAGNOSIS — G90A Postural orthostatic tachycardia syndrome (POTS): Secondary | ICD-10-CM

## 2015-12-21 NOTE — Progress Notes (Signed)
[facility]  PATIENT: Lisa Rios DOB: 28-Aug-1970  REFERRING DOCTOR OR PCP:  Kirby Funk, MD  SOURCE: patient, records from Dr. Valentina Lucks, records/notes from [facility], PSG long report.    _________________________________   HISTORICAL  CHIEF COMPLAINT:  Chief Complaint  Patient presents with  . Narcolepsy    Lisa Rios is here to transfer care of her Narcolepsy.  This was previously managed by a physician at [facility], but he is no longer managing outpatient care. PSG/MSLT received from [facility].  Sts. she is currently doing well on   . POTS    Hx. of POTS, on fludrocortisone; will continue management of this at [facility]/fim    HISTORY OF PRESENT ILLNESS:  I had the pleasure seeing you patient, Lisa Rios, at [facility] for neurologic consultation regarding her POTS (postural orthostatic tachycardic syndrome) and her diagnosis of narcolepsy.  Around 2011-2012, she had episodes of lightheadedness with rare syncope.   She also felt tired much of the day.   She was seen by cardiology and diagnosed with POTS.    Fludrocortisone was first tried but potassium levels were elevated and she stopped.   She was restarted after seeing Dr. Erenest Rasher and is now tolerating fludrocortisone well and feels better.    She was also on Midodrine which helped her hypotension but BP was elevated after starting Xyrem so it was stopped.  She was also on Bystolic and citalopram.       She has not been on Mestinon.    She has had issues with tiredness and fatigue.  This worsens as the day goes on.    She had trouble falling asleep at night (does well on Xyrem).  She has had episodes of sleep paralysis x 2-3 minutes upon awakening and not being sure if she was asleep or awake. She has had some hypnagogic hallucinations.    She also reports cataplexy-like episodes with feelings of weakness and sleepiness not always associated with emotion.   Adderall was prescribed with  mild benefit but she felt nervous and nauseous.    She has trouble with attention and focus.   Sleep symptoms and possible REM intrusion symptoms have improved on Xyrem.  I personally reviewed notes from [facility] (Dr. Jess Barters Preud-Homme) and studies including her PSG performed on 09/04/2013 showing minimal obstructive sleep apnea (AHI equals 1.9) that was mild during REM sleep (REM AHI equals 5.6) there was mild sleep-related hypoxemia.  There is moderate periodic limb movements with an index of 22.1 but negligible arousals with an index of 3.2. Some of the limb movements occurred during rapid eye movement sleep (this can be seen with narcolepsy).   Dr. Lorne Skeens recommended Xyrem due to her history of possible cataplexy episodes, PLMD during sleep and POTS (reports that many patients will have abnormal MSLT's and respond to that medication)..   There is no CSF for orexin levels.   She feels that her fatigue and sleepiness have both improved on Xyrem.    Insurance is changing from [place] to [employer].  She has had nausea and weight loss that is unexplained.     GERD medications have not helped the weight loss  On a typical night, she goes to bed between 9:30 and 10 PM. With the Xyrem, she usually falls asleep quickly. She takes her second dose of Xyrem between 2 and 2:30 AM. She then sleeps an additional 4 hours.   She feels that the Xyrem has significant helped her sleepiness and  fatigue.   EPWORTH SLEEPINESS SCALE (before Xyrem)  On a scale of 0 - 3 what is the chance of dozing:  Sitting and Reading:   1 Watching TV:    3 Sitting inactive in a public place: 0 Passenger in car for one hour: 2 Lying down to rest in the afternoon: 3 Sitting and talking to someone: 0 Sitting quietly after lunch:  1 In a car, stopped in traffic:  1  Total (out of 24)    11/24   Before Xyrem   EPWORTH SLEEPINESS SCALE (while on Xyrem)  On a scale of 0 - 3 what is the chance of dozing:  Sitting and  Reading:   0  Watching TV:    0 Sitting inactive in a public place: 0 Passenger in car for one hour: 0  Lying down to rest in the afternoon: 3 Sitting and talking to someone: 0 Sitting quietly after lunch:  0 In a car, stopped in traffic:  0  Total (out of 24):   3/24  On Xyrem   REVIEW OF SYSTEMS: Constitutional: No fevers, chills, sweats, or change in appetite/   Fatigue and sleepiness Eyes: No visual changes, double vision, eye pain Ear, nose and throat: No hearing loss, ear pain, nasal congestion, sore throat Cardiovascular: No chest pain, palpitations Respiratory: No shortness of breath at rest or with exertion.   No wheezes GastrointestinaI: No nausea, vomiting, diarrhea, abdominal pain, fecal incontinence Genitourinary: No dysuria, urinary retention or frequency.  No nocturia. Musculoskeletal: No neck pain, back pain Integumentary: No rash, pruritus, skin lesions Neurological: as above Psychiatric: No depression at this time.  No anxiety Endocrine: recent weight loss Hematologic/Lymphatic: No anemia, purpura, petechiae. Allergic/Immunologic: No itchy/runny eyes, nasal congestion, recent allergic reactions, rashes  ALLERGIES: Allergies  Allergen Reactions  . Sulfa Antibiotics Nausea And Vomiting    HOME MEDICATIONS:  Current Outpatient Prescriptions:  .  bisoprolol (ZEBETA) 5 MG tablet, , Disp: , Rfl: 11 .  BYSTOLIC 2.5 MG tablet, TK 1/2 T PO QD, Disp: , Rfl: 2 .  citalopram (CELEXA) 10 MG tablet, TAKE 1 TABLET BY MOUTH AT BEDTIME, Disp: , Rfl:  .  fluconazole (DIFLUCAN) 200 MG tablet, TK 1 T PO QOD, Disp: , Rfl: 12 .  fludrocortisone (FLORINEF) 0.1 MG tablet, Take by mouth., Disp: , Rfl:  .  pantoprazole (PROTONIX) 40 MG tablet, TK 1 T PO QD, Disp: , Rfl: 5 .  Sodium Oxybate 500 MG/ML SOLN, Take 3 grams before bedtime then another 3 grams 2.5 to 4 hours afterwards, Disp: , Rfl:  .  thyroid (ARMOUR) 60 MG tablet, Take 60 mg by mouth daily., Disp: , Rfl:   PAST  MEDICAL HISTORY: Past Medical History:  Diagnosis Date  . Anemia   . CFS (chronic fatigue syndrome)   . GERD (gastroesophageal reflux disease)   . Headache(784.0)   . Hypotension 2011  . POTS (postural orthostatic tachycardia syndrome) 2011  . Shortness of breath    related to POTS  . Thyroid disease   . Vision abnormalities     PAST SURGICAL HISTORY: Past Surgical History:  Procedure Laterality Date  . CESAREAN SECTION    . COSMETIC SURGERY    . KIDNEY CYST REMOVAL  2003  . LAPAROSCOPIC ASSISTED VAGINAL HYSTERECTOMY N/A 07/02/2012   Procedure: LAPAROSCOPIC ASSISTED VAGINAL HYSTERECTOMY;  Surgeon: Turner Danielsavid C Lowe, MD;  Location: [facility];  Service: Gynecology;  Laterality: N/A;  . SALPINGOOPHORECTOMY Right 07/02/2012   Procedure: SALPINGO OOPHORECTOMY;  Surgeon: Onalee Huaavid  Clance Boll Lowe, MD;  Location: [facility];  Service: Gynecology;  Laterality: Right;  . TUBAL LIGATION    . UTERINE FIBROID SURGERY      FAMILY HISTORY: Family History  Problem Relation Age of Onset  . Lung cancer    . Diabetes type II Mother   . Colon polyps Father     SOCIAL HISTORY:  Social History   Social History  . Marital status: Divorced    Spouse name: N/A  . Number of children: N/A  . Years of education: N/A   Occupational History  . Not on file.   Social History Main Topics  . Smoking status: Never Smoker  . Smokeless tobacco: Not on file  . Alcohol use Yes     Comment: occasionally  . Drug use: No  . Sexual activity: Not on file   Other Topics Concern  . Not on file   Social History Narrative  . No narrative on file     PHYSICAL EXAM  Vitals:   12/21/15 1020  BP: (!) 90/54  Pulse: 62  Resp: 14  Weight: 116 lb (52.6 kg)    Body mass index is 18.72 kg/m.   Orthostatic VS: Laying:  BP 95/60   Pulse 64 Sitting:    BP 95/60   Pulse 64 Standing 30s BP 90/55   Pulse 68 Stand 3min:   BP 90/60   Pulse 68   General: The patient is well-developed and well-nourished and in no acute  distress  Eyes:  Funduscopic exam shows normal optic discs and retinal vessels.  Neck: The neck is supple, no carotid bruits are noted.  The neck is nontender.  Cardiovascular: The heart has a regular rate and rhythm with a normal S1 and S2. There were no murmurs, gallops or rubs. Lungs are clear to auscultation.  Skin: Extremities are without significant edema.  Musculoskeletal:  Back is nontender  Neurologic Exam  Mental status: The patient is alert and oriented x 3 at the time of the examination. The patient has apparent normal recent and remote memory, with an apparently normal attention span and concentration ability.   Speech is normal.  Cranial nerves: Extraocular movements are full. Pupils are equal, round, and reactive to light and accomodation.   There is good facial sensation to soft touch bilaterally.Facial strength is normal.  Trapezius and sternocleidomastoid strength is normal. No dysarthria is noted.  The tongue is midline, and the patient has symmetric elevation of the soft palate. No obvious hearing deficits are noted.  Motor:  Muscle bulk is normal.   Tone is normal. Strength is  5 / 5 in all 4 extremities.   Sensory: Sensory testing is intact to pinprick, soft touch and vibration sensation in her arms but she has redcued vibration sensation in her toes.  Coordination: Cerebellar testing reveals good finger-nose-finger and heel-to-shin bilaterally.  Gait and station: Station is normal.   Gait is normal. Tandem gait is normal. Romberg is negative.   Reflexes: Deep tendon reflexes are symmetric and normal bilaterally.   Plantar responses are flexor.    DIAGNOSTIC DATA (LABS, IMAGING, TESTING) - I reviewed patient records, labs, notes, testing and imaging myself where available.  Lab Results  Component Value Date   WBC 11.4 (H) 07/03/2012   HGB 10.6 (L) 07/03/2012   HCT 30.7 (L) 07/03/2012   MCV 86.0 07/03/2012   PLT 160 07/03/2012      Component Value  Date/Time   NA 136 06/29/2012 1430   K  4.2 06/29/2012 1430   CL 100 06/29/2012 1430   CO2 28 06/29/2012 1430   GLUCOSE 90 06/29/2012 1430   BUN 9 06/29/2012 1430   CREATININE 0.91 06/29/2012 1430   CALCIUM 9.6 06/29/2012 1430   PROT 6.3 12/16/2011 2050   ALBUMIN 3.2 (L) 12/16/2011 2050   AST 30 12/16/2011 2050   ALT 17 12/16/2011 2050   ALKPHOS 39 12/16/2011 2050   BILITOT 0.3 12/16/2011 2050   GFRNONAA 78 (L) 06/29/2012 1430   GFRAA >90 06/29/2012 1430       ASSESSMENT AND PLAN  POTS (postural orthostatic tachycardia syndrome)  Narcolepsy and cataplexy  Syncope and collapse  Insomnia, unspecified type   In summary, Mrs. Domino is a 45 year old woman with POTS and hypersomnia worrisome for narcolepsy. She had a PSG and does not have superimposed OSA. The MSLT was not performed making the diagnosis of narcolepsy less certain.   She has done very well on Xyrem with improvement of her sleep quality and reduce fatigue and sleepiness during the da.   Additionally she has fewer episodes of sleep paralysis and cataplexy-like events.   I will see if we can keep her on Xyrem.  If not covered, we will check a PSG/MSLT to see if she has sleep onset rem's that would help to diagnose her hypersomnia.    If her POTS worsens, consider low-dose Mestinon (30 mg 3 times a day).  She will return to see me in 3 months or sooner if there are new or worsening neurologic symptoms.  Thank you for asking me to see Mrs. Friedlander. Please let me know if I can be of further assistance with her or other patients in the future.   Kaija Kovacevic A. Epimenio Foot, MD, PhD 12/21/2015, 12:57 PM Certified in Neurology, Clinical Neurophysiology, Sleep Medicine, Pain Medicine and Neuroimaging  [facility] [address], [address] [place], [place] [address] [phone]

## 2015-12-22 ENCOUNTER — Encounter: Payer: Self-pay | Admitting: *Deleted

## 2015-12-31 ENCOUNTER — Telehealth: Payer: Self-pay | Admitting: *Deleted

## 2015-12-31 NOTE — Telephone Encounter (Signed)
Pt is returning your call

## 2015-12-31 NOTE — Telephone Encounter (Signed)
LMTC.  Insurance will not approve Xyrem, as she has not had an MSLT.  If she wishes to continue Xyrem, she will have to have a PSG with MSLT, that is indicative of Narcolepsy.  Does she want to do this?/fim

## 2015-12-31 NOTE — Telephone Encounter (Signed)
LMTC./fim 

## 2016-01-01 MED ORDER — ESZOPICLONE 2 MG PO TABS: 2.0000 mg | ORAL_TABLET | Freq: Every evening | ORAL | 5 refills | Status: AC | PRN

## 2016-01-01 NOTE — Telephone Encounter (Signed)
I have spoken with Lisa Rios this morning, and advised that ins. will not approve Xyrem without PSG/MSLT.  She verbalized understanding of same, does not wish to have studies at this time--sts. is agreeable to trying something else for sleep.  Per RAS, ok for Lunesta 2mg  po qhs prn.  Rx. called to [facility].  Pt. will stop Xyrem, start Lunesta/fim

## 2016-01-01 NOTE — Addendum Note (Signed)
Addended by: Candis SchatzMISENHEIMER, Sharon Rubis I on: 01/01/2016 12:37 PM   Modules accepted: Orders

## 2016-02-02 DIAGNOSIS — Z0000 Encounter for general adult medical examination without abnormal findings: Secondary | ICD-10-CM | POA: Diagnosis not present

## 2016-02-23 DIAGNOSIS — R55 Syncope and collapse: Secondary | ICD-10-CM | POA: Diagnosis not present

## 2016-02-23 DIAGNOSIS — R000 Tachycardia, unspecified: Secondary | ICD-10-CM | POA: Diagnosis not present

## 2016-02-23 DIAGNOSIS — I951 Orthostatic hypotension: Secondary | ICD-10-CM | POA: Diagnosis not present

## 2016-05-06 DIAGNOSIS — I788 Other diseases of capillaries: Secondary | ICD-10-CM | POA: Diagnosis not present

## 2016-07-04 DIAGNOSIS — R635 Abnormal weight gain: Secondary | ICD-10-CM | POA: Diagnosis not present

## 2016-07-04 DIAGNOSIS — R1084 Generalized abdominal pain: Secondary | ICD-10-CM | POA: Diagnosis not present

## 2016-07-04 DIAGNOSIS — R140 Abdominal distension (gaseous): Secondary | ICD-10-CM | POA: Diagnosis not present

## 2016-07-04 DIAGNOSIS — E039 Hypothyroidism, unspecified: Secondary | ICD-10-CM | POA: Diagnosis not present

## 2016-07-05 DIAGNOSIS — R140 Abdominal distension (gaseous): Secondary | ICD-10-CM | POA: Diagnosis not present

## 2016-07-05 DIAGNOSIS — R319 Hematuria, unspecified: Secondary | ICD-10-CM | POA: Diagnosis not present

## 2016-07-05 DIAGNOSIS — R300 Dysuria: Secondary | ICD-10-CM | POA: Diagnosis not present

## 2016-07-11 DIAGNOSIS — R300 Dysuria: Secondary | ICD-10-CM | POA: Diagnosis not present

## 2016-07-11 DIAGNOSIS — R309 Painful micturition, unspecified: Secondary | ICD-10-CM | POA: Diagnosis not present

## 2016-09-14 DIAGNOSIS — R110 Nausea: Secondary | ICD-10-CM | POA: Diagnosis not present

## 2016-09-14 DIAGNOSIS — N951 Menopausal and female climacteric states: Secondary | ICD-10-CM | POA: Diagnosis not present

## 2016-09-14 DIAGNOSIS — G4700 Insomnia, unspecified: Secondary | ICD-10-CM | POA: Diagnosis not present

## 2016-10-19 DIAGNOSIS — Z23 Encounter for immunization: Secondary | ICD-10-CM | POA: Diagnosis not present

## 2016-10-21 DIAGNOSIS — E039 Hypothyroidism, unspecified: Secondary | ICD-10-CM | POA: Diagnosis not present

## 2016-10-28 DIAGNOSIS — E039 Hypothyroidism, unspecified: Secondary | ICD-10-CM | POA: Diagnosis not present

## 2016-12-06 DIAGNOSIS — Z1231 Encounter for screening mammogram for malignant neoplasm of breast: Secondary | ICD-10-CM | POA: Diagnosis not present

## 2016-12-06 DIAGNOSIS — Z01419 Encounter for gynecological examination (general) (routine) without abnormal findings: Secondary | ICD-10-CM | POA: Diagnosis not present

## 2016-12-06 DIAGNOSIS — Z801 Family history of malignant neoplasm of trachea, bronchus and lung: Secondary | ICD-10-CM | POA: Diagnosis not present

## 2016-12-06 DIAGNOSIS — Z803 Family history of malignant neoplasm of breast: Secondary | ICD-10-CM | POA: Diagnosis not present

## 2016-12-06 DIAGNOSIS — Z6821 Body mass index (BMI) 21.0-21.9, adult: Secondary | ICD-10-CM | POA: Diagnosis not present

## 2016-12-27 DIAGNOSIS — Z803 Family history of malignant neoplasm of breast: Secondary | ICD-10-CM | POA: Diagnosis not present

## 2017-02-03 DIAGNOSIS — Z0000 Encounter for general adult medical examination without abnormal findings: Secondary | ICD-10-CM | POA: Diagnosis not present

## 2017-02-21 DIAGNOSIS — K5900 Constipation, unspecified: Secondary | ICD-10-CM | POA: Diagnosis not present

## 2017-02-21 DIAGNOSIS — Z8371 Family history of colonic polyps: Secondary | ICD-10-CM | POA: Diagnosis not present

## 2017-02-21 DIAGNOSIS — K9041 Non-celiac gluten sensitivity: Secondary | ICD-10-CM | POA: Diagnosis not present

## 2017-04-11 DIAGNOSIS — H5211 Myopia, right eye: Secondary | ICD-10-CM | POA: Diagnosis not present

## 2017-04-11 DIAGNOSIS — H04123 Dry eye syndrome of bilateral lacrimal glands: Secondary | ICD-10-CM | POA: Diagnosis not present

## 2017-08-01 DIAGNOSIS — R55 Syncope and collapse: Secondary | ICD-10-CM | POA: Diagnosis not present

## 2017-08-01 DIAGNOSIS — I951 Orthostatic hypotension: Secondary | ICD-10-CM | POA: Diagnosis not present

## 2017-08-01 DIAGNOSIS — R000 Tachycardia, unspecified: Secondary | ICD-10-CM | POA: Diagnosis not present

## 2017-08-08 DIAGNOSIS — R748 Abnormal levels of other serum enzymes: Secondary | ICD-10-CM | POA: Diagnosis not present

## 2017-09-14 DIAGNOSIS — M26602 Left temporomandibular joint disorder, unspecified: Secondary | ICD-10-CM | POA: Diagnosis not present

## 2017-09-14 DIAGNOSIS — G501 Atypical facial pain: Secondary | ICD-10-CM | POA: Diagnosis not present

## 2017-09-14 DIAGNOSIS — M779 Enthesopathy, unspecified: Secondary | ICD-10-CM | POA: Diagnosis not present

## 2017-09-14 DIAGNOSIS — F458 Other somatoform disorders: Secondary | ICD-10-CM | POA: Diagnosis not present

## 2017-10-17 DIAGNOSIS — Z23 Encounter for immunization: Secondary | ICD-10-CM | POA: Diagnosis not present

## 2017-10-25 DIAGNOSIS — E039 Hypothyroidism, unspecified: Secondary | ICD-10-CM | POA: Diagnosis not present

## 2017-11-01 DIAGNOSIS — E039 Hypothyroidism, unspecified: Secondary | ICD-10-CM | POA: Diagnosis not present

## 2018-02-27 DIAGNOSIS — F5104 Psychophysiologic insomnia: Secondary | ICD-10-CM | POA: Diagnosis not present

## 2018-02-27 DIAGNOSIS — E039 Hypothyroidism, unspecified: Secondary | ICD-10-CM | POA: Diagnosis not present

## 2018-02-27 DIAGNOSIS — Z0000 Encounter for general adult medical examination without abnormal findings: Secondary | ICD-10-CM | POA: Diagnosis not present

## 2018-02-27 DIAGNOSIS — I951 Orthostatic hypotension: Secondary | ICD-10-CM | POA: Diagnosis not present

## 2018-02-27 DIAGNOSIS — Z79899 Other long term (current) drug therapy: Secondary | ICD-10-CM | POA: Diagnosis not present

## 2018-03-22 DIAGNOSIS — Z01419 Encounter for gynecological examination (general) (routine) without abnormal findings: Secondary | ICD-10-CM | POA: Diagnosis not present

## 2018-03-22 DIAGNOSIS — Z6821 Body mass index (BMI) 21.0-21.9, adult: Secondary | ICD-10-CM | POA: Diagnosis not present

## 2018-03-22 DIAGNOSIS — Z1231 Encounter for screening mammogram for malignant neoplasm of breast: Secondary | ICD-10-CM | POA: Diagnosis not present

## 2018-11-28 DIAGNOSIS — Z20828 Contact with and (suspected) exposure to other viral communicable diseases: Secondary | ICD-10-CM | POA: Diagnosis not present

## 2019-02-05 DIAGNOSIS — E039 Hypothyroidism, unspecified: Secondary | ICD-10-CM | POA: Diagnosis not present

## 2019-02-12 DIAGNOSIS — E039 Hypothyroidism, unspecified: Secondary | ICD-10-CM | POA: Diagnosis not present

## 2019-03-29 DIAGNOSIS — Z01419 Encounter for gynecological examination (general) (routine) without abnormal findings: Secondary | ICD-10-CM | POA: Diagnosis not present

## 2019-03-29 DIAGNOSIS — F411 Generalized anxiety disorder: Secondary | ICD-10-CM | POA: Diagnosis not present

## 2019-03-29 DIAGNOSIS — Z6822 Body mass index (BMI) 22.0-22.9, adult: Secondary | ICD-10-CM | POA: Diagnosis not present

## 2019-03-29 DIAGNOSIS — Z1231 Encounter for screening mammogram for malignant neoplasm of breast: Secondary | ICD-10-CM | POA: Diagnosis not present

## 2019-04-08 DIAGNOSIS — Z0000 Encounter for general adult medical examination without abnormal findings: Secondary | ICD-10-CM | POA: Diagnosis not present

## 2019-04-08 DIAGNOSIS — Z91018 Allergy to other foods: Secondary | ICD-10-CM | POA: Diagnosis not present

## 2019-04-08 DIAGNOSIS — E039 Hypothyroidism, unspecified: Secondary | ICD-10-CM | POA: Diagnosis not present

## 2019-04-08 DIAGNOSIS — I951 Orthostatic hypotension: Secondary | ICD-10-CM | POA: Diagnosis not present

## 2019-04-11 DIAGNOSIS — L03012 Cellulitis of left finger: Secondary | ICD-10-CM | POA: Diagnosis not present

## 2019-05-17 DIAGNOSIS — G47419 Narcolepsy without cataplexy: Secondary | ICD-10-CM | POA: Diagnosis not present

## 2019-10-11 DIAGNOSIS — Z20828 Contact with and (suspected) exposure to other viral communicable diseases: Secondary | ICD-10-CM | POA: Diagnosis not present

## 2019-10-29 DIAGNOSIS — S40861A Insect bite (nonvenomous) of right upper arm, initial encounter: Secondary | ICD-10-CM | POA: Diagnosis not present

## 2019-10-29 DIAGNOSIS — W57XXXA Bitten or stung by nonvenomous insect and other nonvenomous arthropods, initial encounter: Secondary | ICD-10-CM | POA: Diagnosis not present
